# Patient Record
Sex: Male | Born: 1990 | Race: Black or African American | Hispanic: No | Marital: Single | Smoking: Never smoker
Health system: Southern US, Community
[De-identification: ages and names within clinical notes are randomized; demographics above are authoritative.]

## PROBLEM LIST (undated history)

## (undated) DIAGNOSIS — Q8501 Neurofibromatosis, type 1: Secondary | ICD-10-CM

## (undated) DIAGNOSIS — F32A Depression, unspecified: Secondary | ICD-10-CM

## (undated) DIAGNOSIS — I10 Essential (primary) hypertension: Secondary | ICD-10-CM

## (undated) DIAGNOSIS — H547 Unspecified visual loss: Secondary | ICD-10-CM

## (undated) DIAGNOSIS — F329 Major depressive disorder, single episode, unspecified: Secondary | ICD-10-CM

## (undated) HISTORY — PX: ABDOMINAL ADHESION SURGERY: SHX90

## (undated) HISTORY — PX: EYE SURGERY: SHX253

---
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
## 2010-09-07 ENCOUNTER — Inpatient Hospital Stay: Payer: Self-pay | Admitting: Unknown Physician Specialty

## 2011-01-21 ENCOUNTER — Emergency Department: Payer: Self-pay | Admitting: *Deleted

## 2011-01-24 ENCOUNTER — Inpatient Hospital Stay: Payer: Self-pay | Admitting: Internal Medicine

## 2011-01-26 ENCOUNTER — Inpatient Hospital Stay: Payer: Self-pay | Admitting: Psychiatry

## 2011-02-06 ENCOUNTER — Inpatient Hospital Stay: Payer: Self-pay | Admitting: Psychiatry

## 2011-04-21 ENCOUNTER — Inpatient Hospital Stay: Payer: Self-pay | Admitting: Psychiatry

## 2011-07-14 ENCOUNTER — Emergency Department: Payer: Self-pay | Admitting: Internal Medicine

## 2011-07-19 ENCOUNTER — Emergency Department: Payer: Self-pay | Admitting: Emergency Medicine

## 2011-08-11 ENCOUNTER — Inpatient Hospital Stay: Payer: Self-pay | Admitting: Psychiatry

## 2011-08-11 LAB — COMPREHENSIVE METABOLIC PANEL
Albumin: 4.2 g/dL (ref 3.4–5.0)
Alkaline Phosphatase: 74 U/L (ref 50–136)
BUN: 16 mg/dL (ref 7–18)
Creatinine: 1.04 mg/dL (ref 0.60–1.30)
Glucose: 90 mg/dL (ref 65–99)
SGOT(AST): 39 U/L — ABNORMAL HIGH (ref 15–37)
SGPT (ALT): 29 U/L
Total Protein: 8 g/dL (ref 6.4–8.2)

## 2011-08-11 LAB — CBC
HCT: 42.2 % (ref 40.0–52.0)
HGB: 13.2 g/dL (ref 13.0–18.0)
MCHC: 31.3 g/dL — ABNORMAL LOW (ref 32.0–36.0)
MCV: 77 fL — ABNORMAL LOW (ref 80–100)
RBC: 5.46 10*6/uL (ref 4.40–5.90)
RDW: 13.2 % (ref 11.5–14.5)
WBC: 7.1 10*3/uL (ref 3.8–10.6)

## 2011-08-11 LAB — DRUG SCREEN, URINE
Barbiturates, Ur Screen: NEGATIVE (ref ?–200)
Cannabinoid 50 Ng, Ur ~~LOC~~: NEGATIVE (ref ?–50)
Cocaine Metabolite,Ur ~~LOC~~: NEGATIVE (ref ?–300)
Methadone, Ur Screen: NEGATIVE (ref ?–300)
Opiate, Ur Screen: NEGATIVE (ref ?–300)
Phencyclidine (PCP) Ur S: NEGATIVE (ref ?–25)
Tricyclic, Ur Screen: NEGATIVE (ref ?–1000)

## 2011-08-11 LAB — TSH: Thyroid Stimulating Horm: 9.6 u[IU]/mL — ABNORMAL HIGH

## 2011-08-11 LAB — ACETAMINOPHEN LEVEL: Acetaminophen: <2 ug/mL

## 2011-09-02 ENCOUNTER — Emergency Department: Payer: Self-pay | Admitting: Emergency Medicine

## 2012-04-21 ENCOUNTER — Inpatient Hospital Stay: Payer: Self-pay | Admitting: Psychiatry

## 2012-04-21 DIAGNOSIS — Z79899 Other long term (current) drug therapy: Secondary | ICD-10-CM | POA: Diagnosis not present

## 2012-04-21 DIAGNOSIS — F329 Major depressive disorder, single episode, unspecified: Secondary | ICD-10-CM | POA: Diagnosis not present

## 2012-04-21 DIAGNOSIS — T43501A Poisoning by unspecified antipsychotics and neuroleptics, accidental (unintentional), initial encounter: Secondary | ICD-10-CM | POA: Diagnosis not present

## 2012-04-21 DIAGNOSIS — F489 Nonpsychotic mental disorder, unspecified: Secondary | ICD-10-CM | POA: Diagnosis not present

## 2012-04-21 LAB — COMPREHENSIVE METABOLIC PANEL
BUN: 8 mg/dL (ref 7–18)
Bilirubin,Total: 0.5 mg/dL (ref 0.2–1.0)
Calcium, Total: 9.1 mg/dL (ref 8.5–10.1)
Chloride: 107 mmol/L (ref 98–107)
Creatinine: 0.96 mg/dL (ref 0.60–1.30)
EGFR (African American): >60
EGFR (Non-African Amer.): >60
Glucose: 99 mg/dL (ref 65–99)
Osmolality: 280 (ref 275–301)
Potassium: 3.7 mmol/L (ref 3.5–5.1)
SGPT (ALT): 18 U/L (ref 12–78)
Sodium: 141 mmol/L (ref 136–145)
Total Protein: 7.4 g/dL (ref 6.4–8.2)

## 2012-04-21 LAB — ACETAMINOPHEN LEVEL: Acetaminophen: <2 ug/mL

## 2012-04-21 LAB — DRUG SCREEN, URINE
Barbiturates, Ur Screen: NEGATIVE (ref ?–200)
Benzodiazepine, Ur Scrn: NEGATIVE (ref ?–200)
Cocaine Metabolite,Ur ~~LOC~~: NEGATIVE (ref ?–300)
Opiate, Ur Screen: NEGATIVE (ref ?–300)
Phencyclidine (PCP) Ur S: NEGATIVE (ref ?–25)
Tricyclic, Ur Screen: POSITIVE (ref ?–1000)

## 2012-04-21 LAB — CBC
MCH: 23.9 pg — ABNORMAL LOW (ref 26.0–34.0)
MCV: 76 fL — ABNORMAL LOW (ref 80–100)
Platelet: 128 10*3/uL — ABNORMAL LOW (ref 150–440)
RDW: 14.2 % (ref 11.5–14.5)
WBC: 3.8 10*3/uL (ref 3.8–10.6)

## 2012-04-21 LAB — ETHANOL: Ethanol: <3 mg/dL

## 2012-04-23 DIAGNOSIS — F329 Major depressive disorder, single episode, unspecified: Secondary | ICD-10-CM | POA: Diagnosis not present

## 2012-04-23 LAB — LIPID PANEL
HDL Cholesterol: 62 mg/dL — ABNORMAL HIGH (ref 40–60)
Ldl Cholesterol, Calc: 65 mg/dL (ref 0–100)
Triglycerides: 73 mg/dL (ref 0–200)
VLDL Cholesterol, Calc: 15 mg/dL (ref 5–40)

## 2012-04-24 DIAGNOSIS — F329 Major depressive disorder, single episode, unspecified: Secondary | ICD-10-CM | POA: Diagnosis not present

## 2012-04-25 DIAGNOSIS — S8990XA Unspecified injury of unspecified lower leg, initial encounter: Secondary | ICD-10-CM | POA: Diagnosis not present

## 2012-04-25 DIAGNOSIS — F329 Major depressive disorder, single episode, unspecified: Secondary | ICD-10-CM | POA: Diagnosis not present

## 2012-04-26 DIAGNOSIS — F329 Major depressive disorder, single episode, unspecified: Secondary | ICD-10-CM | POA: Diagnosis not present

## 2012-04-27 DIAGNOSIS — F329 Major depressive disorder, single episode, unspecified: Secondary | ICD-10-CM | POA: Diagnosis not present

## 2012-05-02 ENCOUNTER — Emergency Department: Payer: Self-pay | Admitting: Emergency Medicine

## 2012-05-02 DIAGNOSIS — Z79899 Other long term (current) drug therapy: Secondary | ICD-10-CM | POA: Diagnosis not present

## 2012-05-02 DIAGNOSIS — S93409A Sprain of unspecified ligament of unspecified ankle, initial encounter: Secondary | ICD-10-CM | POA: Diagnosis not present

## 2012-05-02 DIAGNOSIS — M25579 Pain in unspecified ankle and joints of unspecified foot: Secondary | ICD-10-CM | POA: Diagnosis not present

## 2012-05-02 DIAGNOSIS — I10 Essential (primary) hypertension: Secondary | ICD-10-CM | POA: Diagnosis not present

## 2012-05-02 DIAGNOSIS — Q8500 Neurofibromatosis, unspecified: Secondary | ICD-10-CM | POA: Diagnosis not present

## 2013-03-17 ENCOUNTER — Emergency Department: Payer: Self-pay | Admitting: Emergency Medicine

## 2013-03-17 DIAGNOSIS — L089 Local infection of the skin and subcutaneous tissue, unspecified: Secondary | ICD-10-CM | POA: Diagnosis not present

## 2013-03-17 DIAGNOSIS — I10 Essential (primary) hypertension: Secondary | ICD-10-CM | POA: Diagnosis not present

## 2013-03-17 DIAGNOSIS — Z79899 Other long term (current) drug therapy: Secondary | ICD-10-CM | POA: Diagnosis not present

## 2013-03-17 DIAGNOSIS — H5440 Blindness, one eye, unspecified eye: Secondary | ICD-10-CM | POA: Diagnosis not present

## 2013-07-23 ENCOUNTER — Emergency Department: Payer: Self-pay | Admitting: Emergency Medicine

## 2013-07-23 DIAGNOSIS — I10 Essential (primary) hypertension: Secondary | ICD-10-CM | POA: Diagnosis not present

## 2013-07-23 DIAGNOSIS — T50901A Poisoning by unspecified drugs, medicaments and biological substances, accidental (unintentional), initial encounter: Secondary | ICD-10-CM | POA: Diagnosis not present

## 2013-07-23 DIAGNOSIS — T6591XA Toxic effect of unspecified substance, accidental (unintentional), initial encounter: Secondary | ICD-10-CM | POA: Diagnosis not present

## 2013-07-23 DIAGNOSIS — T39314A Poisoning by propionic acid derivatives, undetermined, initial encounter: Secondary | ICD-10-CM | POA: Diagnosis not present

## 2013-07-23 DIAGNOSIS — Q8500 Neurofibromatosis, unspecified: Secondary | ICD-10-CM | POA: Diagnosis not present

## 2013-07-23 DIAGNOSIS — R4182 Altered mental status, unspecified: Secondary | ICD-10-CM | POA: Diagnosis not present

## 2013-07-23 DIAGNOSIS — R918 Other nonspecific abnormal finding of lung field: Secondary | ICD-10-CM | POA: Diagnosis not present

## 2013-07-23 DIAGNOSIS — Z79899 Other long term (current) drug therapy: Secondary | ICD-10-CM | POA: Diagnosis not present

## 2013-07-23 LAB — SALICYLATE LEVEL: Salicylates, Serum: <1.7 mg/dL

## 2013-07-23 LAB — CBC
HCT: 42.8 % (ref 40.0–52.0)
HGB: 13.4 g/dL (ref 13.0–18.0)
MCH: 24 pg — AB (ref 26.0–34.0)
MCHC: 31.3 g/dL — ABNORMAL LOW (ref 32.0–36.0)
MCV: 77 fL — AB (ref 80–100)
PLATELETS: 138 10*3/uL — AB (ref 150–440)
RBC: 5.59 10*6/uL (ref 4.40–5.90)
RDW: 13.5 % (ref 11.5–14.5)
WBC: 8.2 10*3/uL (ref 3.8–10.6)

## 2013-07-23 LAB — COMPREHENSIVE METABOLIC PANEL
ALBUMIN: 3.6 g/dL (ref 3.4–5.0)
ALT: 17 U/L (ref 12–78)
AST: 28 U/L (ref 15–37)
Alkaline Phosphatase: 70 U/L
Anion Gap: 4 — ABNORMAL LOW (ref 7–16)
BUN: 13 mg/dL (ref 7–18)
Bilirubin,Total: 0.5 mg/dL (ref 0.2–1.0)
CALCIUM: 8.8 mg/dL (ref 8.5–10.1)
CO2: 26 mmol/L (ref 21–32)
Chloride: 107 mmol/L (ref 98–107)
Creatinine: 1.2 mg/dL (ref 0.60–1.30)
EGFR (Non-African Amer.): >60
GLUCOSE: 110 mg/dL — AB (ref 65–99)
Osmolality: 275 (ref 275–301)
Potassium: 3.9 mmol/L (ref 3.5–5.1)
SODIUM: 137 mmol/L (ref 136–145)
Total Protein: 7.5 g/dL (ref 6.4–8.2)

## 2013-07-23 LAB — DRUG SCREEN, URINE

## 2013-07-23 LAB — PROTIME-INR
INR: 1
Prothrombin Time: 13.5 secs (ref 11.5–14.7)

## 2013-07-23 LAB — ETHANOL

## 2013-07-23 LAB — ACETAMINOPHEN LEVEL: Acetaminophen: <2 ug/mL

## 2013-07-24 DIAGNOSIS — F332 Major depressive disorder, recurrent severe without psychotic features: Secondary | ICD-10-CM | POA: Diagnosis not present

## 2013-07-24 DIAGNOSIS — R45851 Suicidal ideations: Secondary | ICD-10-CM | POA: Diagnosis not present

## 2013-07-24 LAB — PROTIME-INR
INR: 1
Prothrombin Time: 13.2 secs (ref 11.5–14.7)

## 2013-07-24 LAB — URINALYSIS, COMPLETE
BLOOD: NEGATIVE
Bacteria: NEGATIVE
Bilirubin,UR: NEGATIVE
Glucose,UR: NEGATIVE mg/dL (ref 0–75)
KETONE: NEGATIVE
Leukocyte Esterase: NEGATIVE
NITRITE: NEGATIVE
PH: 5 (ref 4.5–8.0)
RBC, UR: NONE SEEN /HPF (ref 0–5)
SPECIFIC GRAVITY: 1.033 (ref 1.003–1.030)
Squamous Epithelial: NONE SEEN
WBC UR: NONE SEEN /HPF (ref 0–5)

## 2013-07-24 LAB — ACETAMINOPHEN LEVEL: Acetaminophen: <2 ug/mL

## 2013-07-24 LAB — SALICYLATE LEVEL

## 2013-07-25 LAB — PROTIME-INR
INR: 1
Prothrombin Time: 13.1 secs (ref 11.5–14.7)

## 2013-07-26 DIAGNOSIS — F0630 Mood disorder due to known physiological condition, unspecified: Secondary | ICD-10-CM | POA: Diagnosis not present

## 2013-09-08 ENCOUNTER — Emergency Department: Payer: Self-pay | Admitting: Emergency Medicine

## 2013-09-08 DIAGNOSIS — S139XXA Sprain of joints and ligaments of unspecified parts of neck, initial encounter: Secondary | ICD-10-CM | POA: Diagnosis not present

## 2013-12-09 ENCOUNTER — Emergency Department: Payer: Self-pay | Admitting: Emergency Medicine

## 2013-12-09 DIAGNOSIS — H1030 Unspecified acute conjunctivitis, unspecified eye: Secondary | ICD-10-CM | POA: Diagnosis not present

## 2014-10-04 NOTE — H&P (Signed)
PATIENT NAME:  Douglas Paul, Douglas Paul MR#:  [id number] DATE OF BIRTH:  1991/05/14  DATE OF ADMISSION:  04/21/2012  IDENTIFYING INFORMATION AND CHIEF COMPLAINT: This is a 24 year old man brought to the Emergency Room because of concern about an overdose of his medicine.   CHIEF COMPLAINT: "I was just trying to get some sleep."   HISTORY OF PRESENT ILLNESS: Information obtained from the patient and from the chart. The patient was brought to the Emergency Room yesterday with altered mental status, very sleepy, with a report that he had overdosed on Seroquel. He reported that he had taken 10 to 12 of the Seroquel XR 150-mg strength. He tells me today that he took them gradually over the course of a couple of hours because he wanted to get a better night's sleep and had not been sleeping well for days. He tells me that then his mother found him unconscious and knew there was something wrong with him and realized he had overdosed and that is when she called to have him brought to the hospital. The patient denies that he was trying to kill himself, although the more I ask it, the less certain he gets about it. He says that his mood mostly has been fine but then he also says he has his ups and downs that he gets frustrated frequently. He gets frustrated because he does not have a regular job and because he feels like his family is constantly criticizing him and running him down for not having more work. He is frustrated by his isolation and his living situation and his loneliness. He tends to minimize specific symptoms but does admit that he has not slept well for several days that his mood has perhaps been a little bit more frustrated. He totally denies any psychotic symptoms. He denies having any suicidal intent or homicidal ideation. He denies that he had been off of his medicines. He denies abusing alcohol or drugs. He does not present any single specific new stressor in his life.   PAST PSYCHIATRIC HISTORY: Rodel has  had multiple admissions to our hospital over the last few years since he turned 52. Most of his admissions are under similar circumstances with impulsive overdoses, often with stated suicidal ideation and depression.  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 He has responded partially to medication, but has a history of noncompliance. He has also presented with paranoia and psychotic symptoms in the past, although he does not admit it to me today. He has had multiple suicide attempts mostly by overdoses. He has been followed by Dr. Omelia Blackwater in the past but since Dr. Omelia Blackwater moved out of town the patient says that his treatment has changed. He tells me that he sees someone at "[facility]", but he cannot remember the name of them. Evidently, he is supposed to be taking Seroquel XR 150 mg at night and that is allegedly his only psychiatric medicine right now. He does not have a history of substance abuse.   SUBSTANCE ABUSE HISTORY: As noted, he does not have a history of alcohol or drug abuse.   SOCIAL HISTORY: The patient lives with his mother and his younger sister. 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 I do not see how well it has been documented, but it strikes me that he appears to probably have chronic cognitive impairment to some degree. It  is known that he only recently graduated from high school at age 21. He works in [employer], which would suggest that he is identified as a person with disabilities. The patient tells me that he has no friends and never gets out of the house or does anything except to go to his job. He fights with his mother and sister at times when he gets frustrated. He says he cries a lot because his grandmother and mother both insult him and make him feel bad about the fact that he is not working more or making more money. He seems  to have a pretty impoverished social life.   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  The patient has a history of hypertension, which has been treated with propranolol.  He also has been diagnosed with hypothyroidism in the past. Evidently does not have a seizure disorder.   FAMILY HISTORY: Positive for depression and suicide.   CURRENT MEDICATIONS: Seroquel XR 150 mg at night, propranolol 60 mg extended-release in the day. He used to take Synthroid but says that he has been off of that for a while. It is not clear if it was discontinued on purpose or not.   ALLERGIES: No known drug allergies.   REVIEW OF SYSTEMS: Currently he is minimizing symptoms. Denies being depressed. Denies suicidal ideation. Denies any psychotic symptoms. Denies panic attacks. Says that he has had trouble sleeping recently. Appetite is fine. No pain. No other new physical problems. 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   MENTAL STATUS EXAM: Casually dressed, not very well groomed young man who looks his stated age. Cooperative with the interview but fairly passive. Eye contact good. Psychomotor activity slow. Speech slow and decreased in total amount. Affect a little bit blunted, slightly confused. Mood stated as being okay. Thoughts appear simplistic, but otherwise lucid. Did not make any bizarre or delusional statements.  He currently denies auditory or visual hallucinations. Denies suicidal or homicidal ideation. Judgment and insight impaired. Baseline intelligence appears to probably be somewhat impaired. Short and longer-term memory both show some mild degree of impairment. Alert and oriented times four.   PHYSICAL EXAMINATION:  GENERAL: The patient does not appear to be in any acute physical distress. His face appears symmetric. Both of his eyes move conjugately.   HEENT: Both pupils are minimally reactive. Oral mucosa and teeth  are all intact and normal.   NECK: Supple and nontender.   BACK: Nontender.   MUSCULOSKELETAL: Full range of motion at all extremities. Normal gait.   SKIN: He has a large nodule on the left side of his neck which appears chronic. No other skin pathology identified.   NEURO: Strength and reflexes normal throughout. Cranial nerves symmetric and normal.   LUNGS: Clear with no wheezes.   HEART: Regular rate and rhythm.   ABDOMEN: Nontender, normal bowel sounds.   VITAL SIGNS: Temperature 98.8, pulse 76, respirations 20, blood pressure 138/83.   ASSESSMENT:  24 year old man with a history of recurrent depression and suicide attempts comes in after taking an excessive amount of Seroquel. He is denying that there was anything suicidal about it, but it sounds like with his history it is most likely that there was some suicidal intent. Needs hospitalization because of significant risk of suicidal behavior, recurrent depression, and disability.   TREATMENT PLAN: Admit to psychiatry. I am going to double the Seroquel to 300 mg at night as  a more effective dose for depression. The patient agrees to the plan. Engage him in daily group and individual therapy, try to get collateral information if possible, try and get information about his outpatient followup.  I am going to recheck his thyroid tests and get a full thyroid panel. Continue current propranolol. Hold off on Synthroid until we get more lab results. Also we will check lipid panel.   DIAGNOSIS PRINCIPLE AND PRIMARY:  AXIS I: Depression, not otherwise specified.   SECONDARY DIAGNOSES:  AXIS I: Rule out chronic intellectual impairment secondary to generalized medical condition.   AXIS II: Deferred.   AXIS III: Neurofibromatosis, high blood pressure, rule out hypothyroidism.   AXIS IV: Severe from chronic loneliness.   AXIS V: Functioning at time of evaluation: 30.   ____________________________ Audery AmelJohn T. Clapacs,  MD jtc:bjt D: 04/22/2012 13:43:14 ET T: 04/22/2012 14:12:31 ET JOB#: [id number]  cc: Audery AmelJohn T. Clapacs, MD, <Dictator> Audery AmelJOHN T CLAPACS MD ELECTRONICALLY SIGNED 04/22/2012 14:56

## 2014-10-04 NOTE — Discharge Summary (Signed)
PATIENT NAME:  Douglas Paul, Douglas Paul MR#:  [id number] DATE OF BIRTH:  1990/11/21  DATE OF ADMISSION:  04/21/2012 DATE OF DISCHARGE:  04/27/2012  HOSPITAL COURSE: See dictated History and Physical for details of admission. This 24 year old man with a history of recurrent suicide attempts and depressive symptoms came into the hospital after overdosing on his Seroquel. In the hospital, he insisted that he had no suicidal ideation and minimized or denied any of his mood symptoms. He did at least open up to the point of discussing his frustration with his living situation. He frequently feels that his mother and grandmother are putting him down or criticizing him and at the same time feels angry that he is isolated at home with little to do and no transportation. He admits that he had been frustrated and could not really explain his thinking process when he took the overdose. In the hospital, he did not display any dangerous or suicidal behavior. Initially he was fairly withdrawn, but by the time of discharge he had become more interactive, attended some groups, and was more socially appropriate. The patient was counseled extensively about the importance of being more assertive with his family and being more involved in outpatient treatment and social activities. In addition, he was counseled about the importance of staying on his medication and not misusing it. The patient showed improved insight during his hospital stay. Medically he was treated with Seroquel, and the dose was increased to 300 mg at bedtime for what should be a better antidepressant response. He tolerated this well without any difficulty. He continued on propranolol, his usual treatment for his high blood pressure. He was treated with 1% hydrocortisone cream for a rash on his right elbow area.   DISCHARGE MEDICATIONS:  1. Quetiapine extended-release 300 mg p.o. at bedtime.  2. Hydrocortisone 1% cream applied to affected area twice a day as needed for  itching. 3. Propranolol LA 60 mg p.o. in a.m.   LABORATORY, DIAGNOSTIC AND RADIOLOGICAL DATA: Admission labs showed acetaminophen nondetectable. Chemistry panel was entirely normal. Alcohol was nondetectable. CBC showed a platelet count low at 128, MCV low at 76, otherwise normal. Salicylates undetectable. TSH elevated at 6.39. Tricyclic antidepressants positive in the urinalysis drug screen. EKG normal. Total cholesterol 142, triglycerides 73, HDL elevated at 62. Because of his abnormal thyroid tests, follow-up was done and showed a free T4 slightly low at 3.4, T3 uptake high at 44. The patient was not treated for this but was referred for follow-up with his primary care doctor for further evaluation of thyroid issues.   DISCHARGE MENTAL STATUS EXAMINATION: Neatly dressed and groomed young man, looks his stated age. Eye contact adequate. Psychomotor activity normal. Speech quiet, decreased in total amount. Affect smiling but still somewhat constricted. Mood stated as fine. Thoughts appear lucid with no loosening of associations or delusional content, simple and slow, however. Denied auditory or visual hallucinations. Denied suicidal or homicidal ideation. Showed slightly improved insight and judgment. Baseline intelligence appears to probably be somewhat impaired. Short and long-term memory generally intact.   DISPOSITION: The patient is discharged home with his family with follow-up treatment arranged in the community with [facility], Dr. Ellsworth Lennoxejan-Sie, and [facility].   DISCHARGE DIAGNOSIS, PRINCIPAL AND PRIMARY:  AXIS I: Depression, not otherwise specified.   SECONDARY DIAGNOSES:  AXIS I: No further.   AXIS II: Rule out mild mental retardation versus possible autistic disorder.   AXIS III:  1. Chronic blindness. 2. Rule out hypothyroid.  3.  Hypertension.   AXIS IV: Moderate. Chronic stress from burden of illness, limited resources.   AXIS V: Functioning at time of  discharge 55.   ____________________________ Audery Amel, MD jtc:cbb D: 04/28/2012 22:13:53 ET T: 04/29/2012 07:20:56 ET JOB#: [id number]  cc: Audery Amel, MD, <Dictator> Audery Amel MD ELECTRONICALLY SIGNED 04/29/2012 10:04

## 2014-10-04 NOTE — H&P (Signed)
PATIENT NAME:  Douglas Paul, Douglas Paul MR#:  [id number] DATE OF BIRTH:  [birth year]  DATE OF ADMISSION:  04/21/2012  HOSPITAL COURSE: See dictated History and Physical for details of admission. This 24 year old man with a history of recurrent suicide attempts and depressive symptoms came into the hospital after overdosing on his Seroquel. In the hospital, he insisted that he had no suicidal ideation and minimized or denied any of his mood symptoms. He did at least open up to the point of discussing his frustration with his living situation. He frequently feels that his mother and grandmother are putting him down or criticizing him and at the same time feels angry that he is isolated at home with little to do and no transportation. He admits that he had been frustrated and could not really explain his thinking process when he took the overdose. In the hospital, he did not display any dangerous or suicidal behavior. Initially he was fairly withdrawn, but by the time of discharge he had become more interactive, attended some groups, and was more socially appropriate. The patient was counseled extensively about the importance of being more assertive with his family and being more involved in outpatient treatment and social activities. In addition, he was counseled about the importance of staying on his medication and not misusing it. The patient showed improved insight during his hospital stay. Medically he was treated with Seroquel, and the dose was increased to 300 mg at bedtime for what should be a better antidepressant response. He tolerated this well without any difficulty. He continued on propranolol, his usual treatment for his high blood pressure. He was treated with 1% hydrocortisone cream for a rash on his right elbow area.   DISCHARGE MEDICATIONS:  1. Quetiapine extended-release 300 mg p.o. at bedtime.  2. Hydrocortisone 1% cream applied to affected area twice a day as needed for itching. 3. Propranolol LA 60 mg  p.o. in a.m.   LABORATORY, DIAGNOSTIC AND RADIOLOGICAL DATA: Admission labs showed acetaminophen nondetectable. Chemistry panel was entirely normal. Alcohol was nondetectable. CBC showed a platelet count low at 128, MCV low at 76, otherwise normal. Salicylates undetectable. TSH elevated at 6.39. Tricyclic antidepressants positive in the urinalysis drug screen. EKG normal. Total cholesterol 142, triglycerides 73, HDL elevated at 62. Because of his abnormal thyroid tests, follow-up was done and showed a free T4 slightly low at 3.4, T3 uptake high at 44. The patient was not treated for this but was referred for follow-up with his primary care doctor for further evaluation of thyroid issues.   DISCHARGE MENTAL STATUS EXAMINATION: Neatly dressed and groomed young man, looks his stated age. Eye contact adequate. Psychomotor activity normal. Speech quiet, decreased in total amount. Affect smiling but still somewhat constricted. Mood stated as fine. Thoughts appear lucid with no loosening of associations or delusional content, simple and slow, however. Denied auditory or visual hallucinations. Denied suicidal or homicidal ideation. Showed slightly improved insight and judgment. Baseline intelligence appears to probably be somewhat impaired. Short and long-term memory generally intact.   DISPOSITION: The patient is discharged home with his family with follow-up treatment arranged in the community with [facility], Dr. Ellsworth Lennoxejan-Sie, and [facility]   DISCHARGE DIAGNOSIS, PRINCIPAL AND PRIMARY:  AXIS I: Depression, not otherwise specified.   SECONDARY DIAGNOSES:  AXIS I: No further.   AXIS II: Rule out mild mental retardation versus possible autistic disorder.   AXIS III:  1. Chronic blindness. 2. Rule out hypothyroid.  3. Hypertension.   AXIS IV:  Moderate. Chronic stress from burden of illness, limited resources.   AXIS V: Functioning at time of discharge 55.    ____________________________ Audery Amel, MD jtc:cbb D: 04/28/2012 22:13:53 ET T: 04/29/2012 07:20:56 ET JOB#: [id number]  cc: Audery Amel, MD, <Dictator> Audery Amel MD ELECTRONICALLY SIGNED 04/29/2012 10:04

## 2014-10-08 NOTE — Consult Note (Signed)
PATIENT NAME:  Douglas Paul, Douglas Paul MR#:  [id number] DATE OF BIRTH:  October 03, 1990  DATE OF CONSULTATION:  07/24/2013  CONSULTING PHYSICIAN:  Trejuan Matherne K. Franchot Mimes, MD  SEX: Male. RACE: African American. AGE: 24 years.  The patient was seen in consultation in ED-BHU room.  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 The patient is single, never married, and lives with his mother who is in her 11s. The patient was brought to Emergency Room at [facility] after he had taken an overdose of "possibly rat poison or pain medication" that belonged to the mother. The patient denies this, and he stated that he never took rat poison but he took some allergy pills that belonged to his sister. The patient reports that he has been dating this girl for the past 2 months and found out that she "cheated on him on the Facebook and met her on the internet." The patient denies this, and he says his mother does not like this girl because she is bisexual and patient had arguments about the same.   PAST PSYCHIATRIC HISTORY: History of inpatient hold psychiatry once before for episode of depression and taking overdose of pills at [facility] about a year ago. History of suicide attempt once before when he tried to overdose on pills.   ALCOHOL AND DRUGS: Denies drinking alcohol. Denies street or prescription drug abuse. Denies smoking nicotine cigarettes.  OBJECTIVE: The patient is dressed in hospital clothes. Alert and oriented, calm, pleasant and cooperative. Fully aware of situation that brought him to [facility], and he reports that the ambulance was called by his mother because she was concerned about him taking his sister's pills which are allergy pills. Denies feeling depressed and said not now. Denies feeling hopeless or helpless. Denies feeling worthless or useless. Did have suicidal wishes but no suicide thought and no suicide attempt, and he did not want to hurt himself. He was just upset  about the situation. No psychosis. Denies auditory or visual hallucinations. Denies hearing voices. Denies paranoia or suspicious ideas. Denies loss in thought control. Insight and judgment fair.  IMPRESSION: Major depression, recurrent, with suicidal ideas but currently contracts for safety.  PLAN: Continue current observation. Consider discharge on Monday, that is, 07/26/2013, when he has to go back to work. Mr. Sharen Hones is to evaluate the patient on Monday, 07/26/2013, and consider discharge with followup appointment at a local mental health clinic.   ____________________________ Wallace Cullens. Franchot Mimes, MD skc:jcm D: 07/24/2013 19:46:16 ET T: 07/24/2013 20:39:40 ET JOB#: [id number]  cc: Arlyn Leak K. Franchot Mimes, MD, <Dictator> Dewain Penning MD ELECTRONICALLY SIGNED 07/25/2013 12:23

## 2014-10-08 NOTE — Consult Note (Signed)
PATIENT NAME:  Douglas Paul, Douglas Paul MR#:  [id number] DATE OF BIRTH:  08-Jan-1991  DATE OF CONSULTATION:  07/26/2013  REFERRING PHYSICIAN:  Algis Liming. Jimmye Norman, MD CONSULTING PHYSICIAN:  Evaleen Sant B. Kielyn Kardell, MD  REASON FOR CONSULTATION: To evaluate a suicidal patient.   IDENTIFYING DATA: Douglas Paul is a 24 year old male with history of depression and neurofibromatosis.   CHIEF COMPLAINT: "I was arguing with my mother."   HISTORY OF PRESENT ILLNESS: Douglas Paul has been hospitalized at [facility] for depression several times already. In addition to mental illness, he has neurofibromatosis. He has been a patient at [facility] neurology for many years now. His younger sister also suffers the same illness. The patient has a history of poor compliance with psychiatric  medications. He was discharged from [facility] at the end of 2013, but has not seen a psychiatrist or taken any medications ever since. The mother reports that the patient is a difficult one and in the past, he would see a psychiatrist once or twice and then stop taking any medications or seeing a doctor. He did like Dr. Rosine Door. Unfortunately, after Dr. Rosine Door relocated to [facility], this relationship was broken. On the day of admission, the patient was arguing with his mother. His mother does not like his girlfriend who is bisexual, and the patient threatened to eat rat poison. There is no rat poison in the house but instead, he took some allergy medication belonging to his sister. He has been in the Emergency Room for 4 days and has no symptoms of poisoning. He wants to be discharged. He denies that it was a suicide attempt. His mother agrees this is his pattern of behavior when he does not "get his way." He threatens to take pills and oftentimes takes 1 or 2 pills that he can find around the house. The mother did not feel that hospitalization was necessary and felt that the patient needed to return home and to  work. The patient is enormously proud of his ability to awake and support the family. His father left with the family and for the longest time, the mother was the sole provider. When Douglas Paul finished high school, he was thrilled to be able to help.   PAST PSYCHIATRIC HISTORY: He has been hospitalized several times here. There is a history of overdose and a history of drinking antifreeze. He used to see Dr. Rosine Door. He goes to [facility].   FAMILY PSYCHIATRIC HISTORY: Mother and grandmother with depression.   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   MEDICATIONS AT THE TIME OF MOST RECENT DISCHARGE: Inderal 60 mg daily, Seroquel XR 300 mg at night.   ALLERGIES: No known drug allergies.   SOCIAL HISTORY: He has a 28 year old sister. Lives with his mother, who is disabled from [place]. He graduated from high school and now is employed by a [employer], [employer].   REVIEW OF SYSTEMS: CONSTITUTIONAL: No fevers or chills. No weight changes.  EYES: Blindness in the right eye.  ENT: No hearing loss.  RESPIRATORY: No shortness of breath or cough.  CARDIOVASCULAR: No chest pain or orthopnea.  GASTROINTESTINAL: No abdominal pain, nausea, vomiting, or diarrhea.  GENITOURINARY: No incontinence or frequency.  ENDOCRINE: No heat or cold intolerance.  LYMPHATIC: No anemia or easy bruising.  INTEGUMENTARY: No acne or rash.  MUSCULOSKELETAL: No muscle or joint pain.  NEUROLOGIC: Positive for neurofibromatosis  with central nervous system lesions.  PSYCHIATRIC: See history of present illness for details.   PHYSICAL EXAMINATION: VITAL SIGNS: Blood pressure 134/93, pulse 59, respirations 18, temperature 98.  GENERAL: This is a slender young male in no acute distress.   The rest of the physical examination is deferred to his primary attending.   LABORATORY DATA:  Chemistries are within normal limits. Blood alcohol level is zero. LFTs within normal limits. Urine tox screen is positive for tricyclic antidepressants, as if he were taking Seroquel. CBC within normal limits with low platelets of 138. PT and INR within normal limits x 3. Urinalysis is not suggestive of urinary tract infection. Serum acetaminophen and salicylates are low.   EKG: Normal sinus rhythm, normal EKG.   MENTAL STATUS EXAMINATION: The patient is alert and oriented to person, place, time, and situation. He is pleasant, polite, and cooperative. He is very well groomed. He maintains good eye contact. His speech is of normal rhythm, rate, and volume. Mood is fine with full affect. Thought process is logical and goal oriented. Thought content: He denies suicidal or homicidal ideation, but was brought to the Emergency Room after presumed overdose on medication or rat poison. I think that rat poison ingestion was ruled out. There are no delusions or paranoia. There are no auditory or visual hallucinations. His cognition is grossly intact. He registers 3 out of 3 and recalls 3 out of 3 objects after 5 minutes. He can spell "world" forwards and backwards. He knows the current president. His intelligence and fund of knowledge are average. His insight and judgment are limited.   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  AXIS II: None.  AXIS III: Neurofibromatosis, hypertension, blindness in the right eye.  AXIS IV: Mental and physical illness, treatment compliance, family conflict.  AXIS V: Global Assessment of Functioning 55.   PLAN:  1.  The patient denies thoughts of hurting himself or other people. He no longer meets criteria for involuntary inpatient psychiatric commitment. I will terminate proceedings. Please discharge as appropriate.  2.  He is interested in restarting Inderal and Seroquel. I provided prescriptions.  3.  He met  with Lanae Boast from [facility] and agreed to follow up with them. Lanae Boast will provide support and transportation.  4.  His mother will pick him up. She is in court today and was counting on her son helping out.  ____________________________ Wardell Honour. Bary Leriche, MD jbp:jcm D: 07/26/2013 16:20:25 ET T: 07/26/2013 19:23:45 ET JOB#: [id number]  cc: Laydon Martis B. Bary Leriche, MD, <Dictator> Clovis Fredrickson MD ELECTRONICALLY SIGNED 08/15/2013 23:09

## 2014-10-08 NOTE — Consult Note (Signed)
Brief Consult Note: Diagnosis: Major depressive disorder recurrent moderate.   Patient was seen by consultant.   Consult note dictated.   Recommend further assessment or treatment.   Orders entered.   Comments: Mr. Bacigalupi has a h/o depression. He has been off medication since his last discharge in 2013. After an argument with his mother he threatened to overdose on rat poison (none in the house). He ended up taking allergy medication prescribed for his siter.  He is cool and collected. No longer suicidal or homicidal. Agrees to restrt medications.   PLAN: 1. The patient no longer meets criteria for IVC. I will terminate proceedings. Please discharge as appropriate.   2. We restarted Seroquel XR 300 mg at night for depression and Propranolol for HTN. Rx given.  3. He will follow up with [facility]. He met with Lanae Boast from [facility] already here who would assist.  4. Needs a not for work. May return with no restrictions tomorrow.  Electronic Signatures: Orson Slick (MD)  (Signed 09-Feb-15 13:25)  Authored: Brief Consult Note   Last Updated: 09-Feb-15 13:25 by Orson Slick (MD)

## 2014-10-09 NOTE — H&P (Signed)
PATIENT NAME:  Douglas Paul, Douglas Paul MR#:  [id number] DATE OF BIRTH:  07/20/1990  DATE OF ADMISSION:  08/11/2011  INITIAL ASSESSMENT AND PSYCHIATRIC EVALUATION  IDENTIFYING INFORMATION:  The  is a 24 year old African American male who is not married, employed through a temporary agency, and lives with his mother who is 74 years old.  The patient comes for readmission to [facility] after his last discharge from this facility on 04/23/2011 with a chief complaint, "I got into it with my mother and all I did was go outside to pick up soda and she locked me out and I was going to do something stupid to somebody and I could not contract for safety and they brought me here."   HISTORY OF PRESENT ILLNESS:  The patient lives with his mother and has been living with her for many years.  They get into conflicts sometimes and arguments.  According to information obtained from the charts, mother locked the patient out and he was going to do something stupid like hurting his mother by doing something harmful to her and safety was a concern and so he was recommended inpatient hospitalization to psychiatry. The patient has a past history of decompensating under minimal stress and becoming dangerous to himself and behavior becoming unpredictable.  PAST PSYCHIATRIC HISTORY:  The patient has had several inpatient hospitalizations on psychiatry and several hospitalizations to [facility] for depression and problems related to the same.  On one occasion he overdosed himself by drinking antifreeze.  He reports that he is a patient of Dr. Omelia Blackwater and last appointment was in December 2013 and next appointment is to be made.  The patient was last discharged on 04/23/2011 from [facility] after being stabilized for mood disorder secondary to medical condition- neurofibromatosis on the following medications: Zyprexa 10 mg p.o. at bedtime, Prozac  40 mg daily, and Zestril 5 mg daily.  The patient reports that he last took his medications in December 2013 and could not take them anymore because Medicaid would not pay for them.  He has tried to kill himself about four times so far and on one occasion he drank antifreeze.  On three other occasions he tried to overdose on prescription medications.  FAMILY HISTORY OF MENTAL ILLNESS:  Mother and grandmother have depression.  No known history of suicides in the family.  FAMILY HISTORY:  Raised by parents.  His father lives in [place] and is not in touch with him.  The patient lives with his mother.  Mother stays home and does not work.  He has a sister who is 2 years old who lives with the mother and the patient.  The patient gets along "sometimes, not all the time".  Mother is disabled from AIDS and is not able to work.  PERSONAL HISTORY:  Born in [place], [place].  Currently he is in high school and is in special classes and special education and he needs attention on a one-on-one basis.  School is frequently interrupted because of hospitalizations. Currently he is making good grades at school and hopes to graduate this year.  WORK HISTORY:  Worked through temporary agencies and never had a regular job.  MILITARY HISTORY:  Not applicable.  MARRIAGES:  Never married.  Never dated.  Did not go to the high school prom.  No children.  ALCOHOL AND DRUGS:  First drink of alcohol- never had alcohol.  Denies street or prescription drug abuse except  when he overdoses on prescription medications.  He used to smoke nicotine cigarettes, a few per day, and currently quit smoking them.  MEDICAL HISTORY:   No known diabetes mellitus.  No major surgeries.  No major injuries.  Has neurofibromatosis with history of treatment with carboplatin and status post tumor debulking.  Blindness in the right eye from neurofibromatosis.  Does have hypertension, which is mild.  Being followed by Dr. Reece Agar. at [facility] for his neurofibromatosis.  Last appointment was in 2012.  Next appointment is coming up in three months.  PHYSICAL EXAMINATION: VITAL SIGNS:  Blood pressure 120/70 mmHg.  Pulse 80 per minute and regular.  Respirations 20 per minute, temperature 97.6.  HEENT:  Head is normocephalic, atraumatic.  Eyes- pupils equal, round, reactive to light and accommodation.  Fundi bilaterally benign.  NECK:  Supple without any thyromegaly or lymphadenopathy.  LUNGS:  Clear to auscultation.  HEART:   Normal S1, S2 without any murmurs, rubs, or gallops.    ABDOMEN:  Soft, nontender.  Bowel sounds heard.  RECTAL:  Examination deferred.  MUSCULOSKELETAL:  Normal muscle strength in all extremities.  SKIN:  No rashes or bruises.  LYMPH:  No cervical lymphadenopathy.  NEUROLOGICAL:  Gait is normal.  Romberg is negative.  Cranial nerves II-XII grossly intact and normal.  Blindness in the right eye.  DTRs 2+ and normal.  Plantars are normal response.   MENTAL STATUS EXAMINATION:  The patient is dressed in hospital clothes.  Alert and oriented to place, person, and time.  Fully aware of the situation that brought him for admission to inpatient psychiatry.  He is pleasant and cooperative.  Adequately groomed and casually dressed, wearing prescription glasses.  Maintains good eye contact.  Speech is normal in rate and coherent in content.  Admits that he was depressed when he came in but not anymore and he feels hopeful about himself.  Thought processes are logical and goal oriented.  Denies auditory or visual hallucinations.  Denies hearing voices or seeing things.  Denies paranoid or suspicious ideas.  Denies any thought insertion.  He could spell the word "world" forward and backward without any problem.  He could count money.  General knowledge information is fair for level of education.  Cognition grossly intact.  Denies any appetite or sleep disturbance.  Insight and judgment are  fair.  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  AXIS II:  None.  AXIS III:  Neurofibromatosis, mild hypertension, and blindness in the right eye.  AXIS IV:  Chronic mental and physical illness.  Treatment compliance because Medicaid will not fill his medications.  Constant conflicts with his mother that he lives with.  AXIS V:  GAF 25.    PLAN:  The patient is admitted to [facility] for close observation and management and help.  He will be started back on all of his medications that he was recommended at the time of discharge in November 2012.  These medications have really helped him.  During the stay in the hospital he will be given milieu therapy and supportive counseling.  He will take part in individual and group therapy when he is ready to do so where conflicts with family members and dealing with adequate coping skills will be discussed.  Social services will contact the family and find out how best they can help so that the patient and mother can relate with each other.  At the  time of discharge the patient will be stable and appropriate follow-up appointments will be given and community and social services will find out about medication compliance and a way to deal with it and find a program that will help him.   ____________________________ Jannet MantisSurya K. Guss Bundehalla, MD skc:bjt D: 08/11/2011 19:22:33 ET T: 08/12/2011 06:23:40 ET JOB#: [id number]  cc: Monika SalkSurya K. Guss Bundehalla, MD, <Dictator> Beau FannySURYA K Haylynn Pha MD ELECTRONICALLY SIGNED 08/18/2011 20:20

## 2015-09-12 DIAGNOSIS — I10 Essential (primary) hypertension: Secondary | ICD-10-CM | POA: Diagnosis not present

## 2015-09-12 DIAGNOSIS — Q8501 Neurofibromatosis, type 1: Secondary | ICD-10-CM | POA: Diagnosis not present

## 2015-09-12 DIAGNOSIS — E039 Hypothyroidism, unspecified: Secondary | ICD-10-CM | POA: Diagnosis not present

## 2016-01-26 ENCOUNTER — Encounter: Payer: Self-pay | Admitting: Emergency Medicine

## 2016-01-26 ENCOUNTER — Emergency Department: Payer: Medicare Other

## 2016-01-26 ENCOUNTER — Emergency Department
Admission: EM | Admit: 2016-01-26 | Discharge: 2016-01-26 | Disposition: A | Discharge status: Home / Self Care | Payer: Medicare Other | Attending: Student | Admitting: Student

## 2016-01-26 DIAGNOSIS — K859 Acute pancreatitis without necrosis or infection, unspecified: Secondary | ICD-10-CM | POA: Insufficient documentation

## 2016-01-26 DIAGNOSIS — R52 Pain, unspecified: Secondary | ICD-10-CM

## 2016-01-26 DIAGNOSIS — G8929 Other chronic pain: Secondary | ICD-10-CM | POA: Diagnosis not present

## 2016-01-26 DIAGNOSIS — I10 Essential (primary) hypertension: Secondary | ICD-10-CM | POA: Insufficient documentation

## 2016-01-26 DIAGNOSIS — K5900 Constipation, unspecified: Secondary | ICD-10-CM | POA: Insufficient documentation

## 2016-01-26 DIAGNOSIS — R1013 Epigastric pain: Secondary | ICD-10-CM | POA: Diagnosis not present

## 2016-01-26 DIAGNOSIS — M25512 Pain in left shoulder: Secondary | ICD-10-CM | POA: Diagnosis not present

## 2016-01-26 DIAGNOSIS — R748 Abnormal levels of other serum enzymes: Secondary | ICD-10-CM | POA: Diagnosis not present

## 2016-01-26 HISTORY — DX: Essential (primary) hypertension: I10

## 2016-01-26 HISTORY — DX: Neurofibromatosis, type 1: Q85.01

## 2016-01-26 HISTORY — DX: Depression, unspecified: F32.A

## 2016-01-26 HISTORY — DX: Unspecified visual loss: H54.7

## 2016-01-26 HISTORY — DX: Major depressive disorder, single episode, unspecified: F32.9

## 2016-01-26 LAB — URINALYSIS COMPLETE WITH MICROSCOPIC (ARMC ONLY)
BACTERIA UA: NONE SEEN
Bilirubin Urine: NEGATIVE
GLUCOSE, UA: NEGATIVE mg/dL
Hgb urine dipstick: NEGATIVE
Leukocytes, UA: NEGATIVE
Nitrite: NEGATIVE
Protein, ur: NEGATIVE mg/dL
Specific Gravity, Urine: 1.024 (ref 1.005–1.030)
Squamous Epithelial / LPF: NONE SEEN
WBC UA: NONE SEEN WBC/hpf (ref 0–5)
pH: 5 (ref 5.0–8.0)

## 2016-01-26 LAB — CBC WITH DIFFERENTIAL/PLATELET
Basophils Absolute: 0 10*3/uL (ref 0–0.1)
Basophils Relative: 1 %
Eosinophils Absolute: 0 10*3/uL (ref 0–0.7)
Eosinophils Relative: 1 %
HEMATOCRIT: 45.3 % (ref 40.0–52.0)
HEMOGLOBIN: 14.2 g/dL (ref 13.0–18.0)
LYMPHS ABS: 1.5 10*3/uL (ref 1.0–3.6)
Lymphocytes Relative: 22 %
MCH: 23.8 pg — AB (ref 26.0–34.0)
MCHC: 31.4 g/dL — AB (ref 32.0–36.0)
MCV: 76 fL — ABNORMAL LOW (ref 80.0–100.0)
MONO ABS: 0.5 10*3/uL (ref 0.2–1.0)
MONOS PCT: 8 %
NEUTROS ABS: 4.5 10*3/uL (ref 1.4–6.5)
NEUTROS PCT: 68 %
Platelets: 128 10*3/uL — ABNORMAL LOW (ref 150–440)
RBC: 5.96 MIL/uL — ABNORMAL HIGH (ref 4.40–5.90)
RDW: 13.2 % (ref 11.5–14.5)
WBC: 6.5 10*3/uL (ref 3.8–10.6)

## 2016-01-26 LAB — COMPREHENSIVE METABOLIC PANEL
ALK PHOS: 60 U/L (ref 38–126)
ALT: 19 U/L (ref 17–63)
ANION GAP: 6 (ref 5–15)
AST: 26 U/L (ref 15–41)
Albumin: 4.3 g/dL (ref 3.5–5.0)
BILIRUBIN TOTAL: 0.8 mg/dL (ref 0.3–1.2)
BUN: 11 mg/dL (ref 6–20)
CALCIUM: 9.3 mg/dL (ref 8.9–10.3)
CO2: 27 mmol/L (ref 22–32)
CREATININE: 0.91 mg/dL (ref 0.61–1.24)
Chloride: 104 mmol/L (ref 101–111)
GLUCOSE: 76 mg/dL (ref 65–99)
Potassium: 3.8 mmol/L (ref 3.5–5.1)
Sodium: 137 mmol/L (ref 135–145)
Total Protein: 7.5 g/dL (ref 6.5–8.1)

## 2016-01-26 LAB — LIPASE, BLOOD: Lipase: 240 U/L — ABNORMAL HIGH (ref 11–51)

## 2016-01-26 MED ORDER — ONDANSETRON 4 MG PO TBDP: 4.0000 mg | ORAL_TABLET | Freq: Three times a day (TID) | ORAL | 0 refills | Status: DC | PRN

## 2016-01-26 MED ORDER — ONDANSETRON HCL 4 MG/2ML IJ SOLN
4.0000 mg | Freq: Once | INTRAMUSCULAR | Status: AC
  Administered 2016-01-26: 4 mg via INTRAVENOUS
  Filled 2016-01-26: qty 2

## 2016-01-26 MED ORDER — OXYCODONE HCL 5 MG PO TABS: 5.0000 mg | ORAL_TABLET | Freq: Four times a day (QID) | ORAL | 0 refills | Status: DC | PRN

## 2016-01-26 MED ORDER — SODIUM CHLORIDE 0.9 % IV BOLUS (SEPSIS)
1000.0000 mL | Freq: Once | INTRAVENOUS | Status: AC
  Administered 2016-01-26: 1000 mL via INTRAVENOUS

## 2016-01-26 MED ORDER — MORPHINE SULFATE (PF) 4 MG/ML IV SOLN
4.0000 mg | Freq: Once | INTRAVENOUS | Status: AC
  Administered 2016-01-26: 4 mg via INTRAVENOUS
  Filled 2016-01-26: qty 1

## 2016-01-26 NOTE — ED Provider Notes (Signed)
[facility] Emergency Department Provider Note   ____________________________________________   First MD Initiated Contact with Patient 01/26/16 1518     (approximate)  I have reviewed the triage vital signs and the nursing notes.   HISTORY  Chief Complaint Abdominal Pain    HPI Douglas Paul is a 25 y.o. 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 He has had some nausea but no vomiting, no diarrhea, no fevers or chills. He has chronic constipation. No pain or burning with urination. He has never had this before. Seen at urgent care and sent to the ER for mild amylase and lipase elevations. He drinks alcohol infrequently "once or twice a year" and has not had any alcohol this week. He denies any chest pain or difficulty breathing.   Past Medical History:  Diagnosis Date  . Depression   . Hypertension   . Neurofibromatosis, type 1 (HCC)   . Partial blindness    right eye    There are no active problems to display for this patient.   Past Surgical History:  Procedure Laterality Date  . ABDOMINAL ADHESION SURGERY     tumors removed  . EYE SURGERY      Prior to Admission medications   Not on File    Allergies Review of patient's allergies indicates no known allergies.  No family history on file.  Social History Social History  Substance Use Topics  . Smoking status: Never Smoker  . Smokeless tobacco: Never Used  . Alcohol use No    Review of Systems Constitutional: No fever/chills Eyes: No visual changes. ENT: No sore throat. Cardiovascular: Denies chest pain. Respiratory: Denies shortness of breath. Gastrointestinal: + abdominal pain.  + nausea, no vomiting.  No diarrhea.  + constipation. Genitourinary: Negative for  dysuria. Musculoskeletal: Negative for back pain. Skin: Negative for rash. Neurological: Negative for headaches, focal weakness or numbness.  10-point ROS otherwise negative.  ____________________________________________   PHYSICAL EXAM:  Vitals:   01/26/16 1452 01/26/16 1601  BP: 134/90 (!) 148/98  Pulse: (!) 53 64  Resp: 17 16  Temp: 97.9 F (36.6 C)   TempSrc: Oral Oral  SpO2: 100% 100%  Weight: 150 lb (68 kg)   Height:  (1.753 m)       Constitutional: Alert and oriented. Well appearing and in no acute distress. Eyes: Conjunctivae are normal. PERRL. EOMI. Head: Atraumatic. Nose: No congestion/rhinnorhea. Mouth/Throat: Mucous membranes are moist.  Oropharynx non-erythematous. Neck: No stridor.   Cardiovascular: Normal rate, regular rhythm. Grossly normal heart sounds.  Good peripheral circulation. Respiratory: Normal respiratory effort.  No retractions. Lungs CTAB. Gastrointestinal: Soft With mild epigastric tenderness. Normal bowel sounds, no rebound or guarding. No rigidity. No CVA tenderness. Genitourinary: Deferred Musculoskeletal: No lower extremity tenderness nor edema.  No joint effusions. Neurologic:  Normal speech and language. No gross focal neurologic deficits are appreciated. No gait instability. Skin:  Skin is warm, dry and intact. No rash noted. Psychiatric: Mood and affect are normal. Speech and behavior are normal.  ____________________________________________   LABS (all labs ordered are listed, but only abnormal results are displayed)  Labs Reviewed  CBC WITH DIFFERENTIAL/PLATELET - Abnormal; Notable for the following:       Result Value   RBC 5.96 (*)    MCV 76.0 (*)    MCH 23.8 (*)  MCHC 31.4 (*)    Platelets 128 (*)    All other components within normal limits  LIPASE, BLOOD - Abnormal; Notable for the following:    Lipase 240 (*)    All other components within normal limits  URINALYSIS COMPLETEWITH MICROSCOPIC ([facility] ONLY) -  Abnormal; Notable for the following:    Color, Urine YELLOW (*)    APPearance CLEAR (*)    Ketones, ur TRACE (*)    All other components within normal limits  COMPREHENSIVE METABOLIC PANEL   ____________________________________________  EKG  none ____________________________________________  RADIOLOGY  RUQ ultrasound  IMPRESSION: Normal right upper quadrant ultrasound. ____________________________________________   PROCEDURES  Procedure(s) performed: None  Procedures  Critical Care performed: No  ____________________________________________   INITIAL IMPRESSION / ASSESSMENT AND PLAN / ED COURSE  Pertinent labs & imaging results that were available during my care of the patient were reviewed by me and considered in my medical decision making (see chart for details).  Douglas Paul is a 25 y.o. 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 On exam, he is very well-appearing and in no acute distress. Vital signs stable, he is afebrile. Normal bowel sounds with mild tenderness in the epigastrium, no rebound or guarding. We will obtain abdominal pain labs, urinalysis, likely right upper quadrant ultrasound to rule out obstructing stone at the cause of pancreatitis. We'll treat his pain and reassess for disposition.  ----------------------------------------- 5:02 PM on 01/26/2016 ----------------------------------------- Patient with significant improvement of his pain at this time. I reviewed his labs. CBC shows mild chronic thrombocytopenia. Unremarkable CMP, lipase is mildly elevated 240, unremarkable ultrasound of the gallbladder, no obstructing stone. Urinalysis with microhematuria. I offered admission if the patient feels poorly however he reports he feels very well, is tolerating by mouth intake. I discussed that the cause of his mild  pancreatitis is not clear to me and he needs to follow up quickly with his primary care doctor. We discussed return precautions and need for close PCP follow-up and he is comfortable with the discharge plan. DC home.  Clinical Course     ____________________________________________   FINAL CLINICAL IMPRESSION(S) / ED DIAGNOSES  Final diagnoses:  Pain  Epigastric pain  Acute pancreatitis, unspecified pancreatitis type      NEW MEDICATIONS STARTED DURING THIS VISIT:  New Prescriptions   No medications on file     Note:  This document was prepared using Dragon voice recognition software and may include unintentional dictation errors.    Gayla DossEryka A Jesyka Slaght, MD 01/26/16 [phone]

## 2016-01-26 NOTE — Discharge Instructions (Signed)
As we discussed, you have mild elevation of your lipase which represents pancreatitis though the exact cause of this is unclear. You Need to follow up with your primary care doctor as soon as possible for recheck. We also found a tinyamount of blood in your urine, your primary care doctor needs to recheck this within the week to ensure that it has resolved on its own. Return immediately to the emergency Department for severe orworsening abdominal pain, vomiting, blood in vomit or stool, fevers, chest pain or difficulty breathing or for any other consultants.

## 2016-01-26 NOTE — ED Triage Notes (Addendum)
Pt reports upper abdominal pain that started 2 days ago, denies N/V/D. [facility] for pancreat[facility] eval due to elevated lipase and amylase. Pt also with a hx of neurofibromatosis. [facility].

## 2016-04-17 DIAGNOSIS — F331 Major depressive disorder, recurrent, moderate: Secondary | ICD-10-CM | POA: Diagnosis not present

## 2017-03-20 ENCOUNTER — Encounter: Payer: Self-pay | Admitting: *Deleted

## 2017-03-20 ENCOUNTER — Emergency Department
Admission: EM | Admit: 2017-03-20 | Discharge: 2017-03-20 | Disposition: A | Discharge status: Home / Self Care | Payer: Medicare Other | Attending: Emergency Medicine | Admitting: Emergency Medicine

## 2017-03-20 DIAGNOSIS — Y929 Unspecified place or not applicable: Secondary | ICD-10-CM | POA: Diagnosis not present

## 2017-03-20 DIAGNOSIS — I10 Essential (primary) hypertension: Secondary | ICD-10-CM | POA: Diagnosis not present

## 2017-03-20 DIAGNOSIS — S4992XA Unspecified injury of left shoulder and upper arm, initial encounter: Secondary | ICD-10-CM | POA: Diagnosis present

## 2017-03-20 DIAGNOSIS — X58XXXA Exposure to other specified factors, initial encounter: Secondary | ICD-10-CM | POA: Insufficient documentation

## 2017-03-20 DIAGNOSIS — Y9389 Activity, other specified: Secondary | ICD-10-CM | POA: Insufficient documentation

## 2017-03-20 DIAGNOSIS — Y998 Other external cause status: Secondary | ICD-10-CM | POA: Diagnosis not present

## 2017-03-20 DIAGNOSIS — S46912A Strain of unspecified muscle, fascia and tendon at shoulder and upper arm level, left arm, initial encounter: Secondary | ICD-10-CM | POA: Diagnosis not present

## 2017-03-20 DIAGNOSIS — Q8501 Neurofibromatosis, type 1: Secondary | ICD-10-CM | POA: Diagnosis not present

## 2017-03-20 DIAGNOSIS — F329 Major depressive disorder, single episode, unspecified: Secondary | ICD-10-CM | POA: Insufficient documentation

## 2017-03-20 MED ORDER — NABUMETONE 750 MG PO TABS: 750.0000 mg | ORAL_TABLET | Freq: Two times a day (BID) | ORAL | 0 refills | Status: DC

## 2017-03-20 NOTE — ED Provider Notes (Signed)
[facility] Emergency Department Provider Note ____________________________________________  Time seen: 1511  I have reviewed the triage vital signs and the nursing notes.  HISTORY  Chief Complaint  Shoulder Injury  HPI Douglas Paul is a 26 y.o. male Presents to the ED for evaluation of intermittent left shoulder pain. Patient describes he believes he was seen here about 6 months ago, but review of his chart reveals a visit about 12 months ago to [facility] with complaints of left shoulder pain. He was subsequently referred to orthopedics, but admittedly, never made a follow-up appointment. He describes intermittent shoulder pain is aggravated by his work activities. 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 He denies any recent or previous injury to the left shoulder. He has not taken any medications in the interim for symptom relief. He is without distal paresthesias, grip changes, or chest pain.  Past Medical History:  Diagnosis Date  . Depression   . Hypertension   . Neurofibromatosis, type 1 (HCC)   . Partial blindness    right eye    There are no active problems to display for this patient.   Past Surgical History:  Procedure Laterality Date  . ABDOMINAL ADHESION SURGERY     tumors removed  . EYE SURGERY      Prior to Admission medications   Medication Sig Start Date End Date Taking? Authorizing Provider  nabumetone (RELAFEN) 750 MG tablet Take 1 tablet (750 mg total) by mouth 2 (two) times daily. 03/20/17   Avalin Briley, Charlesetta Ivory, PA-C  ondansetron (ZOFRAN ODT) 4 MG disintegrating tablet Take 1 tablet (4 mg total) by mouth every 8 (eight) hours as needed for nausea or vomiting. 01/26/16   Gayla Doss, MD  oxyCODONE (ROXICODONE) 5 MG immediate release tablet Take 1 tablet (5 mg total) by mouth every 6 (six) hours as needed for moderate pain. Do not drive while taking this medication. 01/26/16   Gayla Doss, MD    Allergies Patient has  no known allergies.  History reviewed. No pertinent family history.  Social History Social History  Substance Use Topics  . Smoking status: Never Smoker  . Smokeless tobacco: Never Used  . Alcohol use No    Review of Systems  Constitutional: Negative for fever. Musculoskeletal: Negative for back pain. Left shoulder pain as above. Skin: Negative for rash. Neurological: Negative for headaches, focal weakness or numbness. ____________________________________________  PHYSICAL EXAM:  VITAL SIGNS: ED Triage Vitals  Enc Vitals Group     BP 03/20/17 1440 (!) 146/92     Pulse Rate 03/20/17 1440 76     Resp 03/20/17 1440 18     Temp 03/20/17 1440 98.9 F (37.2 C)     Temp Source 03/20/17 1440 Oral     SpO2 03/20/17 1440 99 %     Weight 03/20/17 1440 151 lb (68.5 kg)     Height 03/20/17 1440  (1.753 m)     Head Circumference --      Peak Flow --      Pain Score 03/20/17 1437 0     Pain Loc --      Pain Edu? --      Excl. in GC? --     Constitutional: Alert and oriented. Well appearing and in no distress. Head: Normocephalic and atraumatic. Eyes: Conjunctivae are normal. Normal extraocular movements Neck: Supple. No thyromegaly. Cardiovascular: Normal rate, regular rhythm. Normal distal pulses. Respiratory: Normal respiratory effort. No wheezes/rales/rhonchi. Musculoskeletal: left  shoulder without any obvious deformity, dislocation, or sulcus sign. Patient with fully active range of motion to shoulder. No rotator cuff laxity or deficit is appreciated. Normal composite fist. Nontender with normal range of motion in all extremities.  Neurologic: cranial nerves II through XII grossly intact. Normal grips bilaterally. Normal gait without ataxia. Normal speech and language. No gross focal neurologic deficits are appreciated. ____________________________________________   RADIOLOGY  deferred ____________________________________________  INITIAL IMPRESSION / ASSESSMENT AND  PLAN / ED COURSE  DDX: dislocation, RC tendinitis, OA, bursitis  Patient to the ED with chronic, intermittent left shoulder pain. He has no recent injury or trauma. Prior evaluation within the [facility] system reveals negative shoulder x-rays and referral to orthopedics. The patient is discharged after be is consistent with a mild tendinitis with prescriptions for Relafen. He is also referred to orthopedics for further evaluation.  A work note is provided for today as requested. ____________________________________________  FINAL CLINICAL IMPRESSION(S) / ED DIAGNOSES  Final diagnoses:  Shoulder strain, left, initial encounter      Lissa Hoard, PA-C 03/20/17 1626    Rockne Menghini, MD 03/20/17 [phone]

## 2017-03-20 NOTE — ED Notes (Signed)
Pt reports that he has had a shoulder injury in the past. At work he does repetitive work with his arms and his neck shoulder and neck are hurting him.

## 2017-03-20 NOTE — Discharge Instructions (Signed)
Your exam is essentially normal today. Your previous x-rays are negative. You have some shoulder strain and mild tendinitis. Take the prescription med as directed. Do the exercises as provided. Follow-up with orthopedics for ongoing symptoms.

## 2017-03-20 NOTE — ED Triage Notes (Signed)
Pt states left shoulder injury, states hx of a "torn muscle" in that arm, states pain is only present when he is lifting things

## 2017-09-20 ENCOUNTER — Emergency Department: Payer: Medicare Other

## 2017-09-20 DIAGNOSIS — Z5321 Procedure and treatment not carried out due to patient leaving prior to being seen by health care provider: Secondary | ICD-10-CM | POA: Insufficient documentation

## 2017-09-20 DIAGNOSIS — R51 Headache: Secondary | ICD-10-CM | POA: Insufficient documentation

## 2017-09-20 NOTE — ED Triage Notes (Signed)
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

## 2017-09-20 NOTE — ED Triage Notes (Signed)
Patient c/o headache X 4 days. Patient reports bilateral hand tingling. Patient reports hx of previous headaches, however, reports this is different; it's more severe and has lasted longer than previous headaches.

## 2017-09-21 ENCOUNTER — Emergency Department
Admission: EM | Admit: 2017-09-21 | Discharge: 2017-09-21 | Disposition: A | Discharge status: Home / Self Care | Payer: Medicare Other | Attending: Emergency Medicine | Admitting: Emergency Medicine

## 2017-09-22 ENCOUNTER — Telehealth: Payer: Self-pay | Admitting: Emergency Medicine

## 2017-09-22 NOTE — Telephone Encounter (Signed)
Called patient due to lwot to inquire about condition and follow up plans.  No answer and no voicemail  

## 2017-09-26 ENCOUNTER — Emergency Department
Admission: EM | Admit: 2017-09-26 | Discharge: 2017-09-26 | Disposition: A | Discharge status: Home / Self Care | Payer: Medicare Other | Attending: Emergency Medicine | Admitting: Emergency Medicine

## 2017-09-26 ENCOUNTER — Encounter: Payer: Self-pay | Admitting: Emergency Medicine

## 2017-09-26 ENCOUNTER — Emergency Department: Payer: Medicare Other

## 2017-09-26 DIAGNOSIS — I10 Essential (primary) hypertension: Secondary | ICD-10-CM | POA: Diagnosis not present

## 2017-09-26 DIAGNOSIS — M25512 Pain in left shoulder: Secondary | ICD-10-CM | POA: Diagnosis not present

## 2017-09-26 MED ORDER — PREDNISONE 50 MG PO TABS: ORAL_TABLET | ORAL | 0 refills | Status: DC

## 2017-09-26 MED ORDER — METHOCARBAMOL 500 MG PO TABS: 500.0000 mg | ORAL_TABLET | Freq: Three times a day (TID) | ORAL | 0 refills | Status: AC | PRN

## 2017-09-26 NOTE — ED Provider Notes (Signed)
[facility] Emergency Department Provider Note  ____________________________________________  Time seen: Approximately 7:23 PM  I have reviewed the triage vital signs and the nursing notes.   HISTORY  Chief Complaint Shoulder Pain    HPI Douglas Paul is a 27 y.o. male presents to the emergency department with 6 out of 10 burning, left shoulder pain.  Patient reports that left shoulder pain has occurred for the past 3-5 days.  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  He denies weakness or changes in sensation of the upper extremities.  He denies falls or mechanisms of trauma.  Patient does have a history of neurofibromatosis.  No alleviating measures have been attempted.   Past Medical History:  Diagnosis Date  . Depression   . Hypertension   . Neurofibromatosis, type 1 (HCC)   . Partial blindness    right eye    There are no active problems to display for this patient.   Past Surgical History:  Procedure Laterality Date  . ABDOMINAL ADHESION SURGERY     tumors removed  . EYE SURGERY      Prior to Admission medications   Medication Sig Start Date End Date Taking? Authorizing Provider  methocarbamol (ROBAXIN) 500 MG tablet Take 1 tablet (500 mg total) by mouth 3 (three) times daily as needed for up to 5 days for muscle spasms. 09/26/17 10/01/17  Orvil Feil, PA-C  nabumetone (RELAFEN) 750 MG tablet Take 1 tablet (750 mg total) by mouth 2 (two) times daily. 03/20/17   Menshew, Charlesetta Ivory, PA-C  ondansetron (ZOFRAN ODT) 4 MG disintegrating tablet Take 1 tablet (4 mg total) by mouth every 8 (eight) hours as needed for nausea or vomiting. 01/26/16   Gayla Doss, MD  oxyCODONE (ROXICODONE) 5 MG immediate release tablet Take 1 tablet (5 mg total) by mouth every 6 (six) hours as needed for moderate pain. Do not drive while taking this medication. 01/26/16   Gayla Doss, MD   predniSONE (DELTASONE) 50 MG tablet Take one 50 mg tablet once daily for the next five days. 09/26/17   Orvil Feil, PA-C    Allergies Patient has no known allergies.  No family history on file.  Social History Social History   Tobacco Use  . Smoking status: Never Smoker  . Smokeless tobacco: Never Used  Substance Use Topics  . Alcohol use: No  . Drug use: Not on file     Review of Systems  Constitutional: No fever/chills Eyes: No visual changes. No discharge ENT: No upper respiratory complaints. Cardiovascular: no chest pain. Respiratory: no cough. No SOB. Gastrointestinal: No abdominal pain.  No nausea, no vomiting.  No diarrhea.  No constipation. Musculoskeletal: Patient has left shoulder pain. Skin: Negative for rash, abrasions, lacerations, ecchymosis. Neurological: Negative for headaches, focal weakness or numbness.   ____________________________________________   PHYSICAL EXAM:  VITAL SIGNS: ED Triage Vitals  Enc Vitals Group     BP 09/26/17 1825 (!) 160/109     Pulse Rate 09/26/17 1825 63     Resp 09/26/17 1825 18     Temp 09/26/17 1825 98.4 F (36.9 C)     Temp Source 09/26/17 1825 Oral     SpO2 09/26/17 1825 100 %     Weight 09/26/17 1824 152 lb (68.9 kg)     Height 09/26/17 1824 5\' 9"  (1.753 m)     Head Circumference --  Peak Flow --      Pain Score 09/26/17 1824 9     Pain Loc --      Pain Edu? --      Excl. in GC? --      Constitutional: Alert and oriented. Well appearing and in no acute distress. Eyes: Conjunctivae are normal. PERRL. EOMI. Head: Atraumatic. Cardiovascular: Normal rate, regular rhythm. Normal S1 and S2.  Good peripheral circulation. Respiratory: Normal respiratory effort without tachypnea or retractions. Lungs CTAB. Good air entry to the bases with no decreased or absent breath sounds. Musculoskeletal: Patient demonstrates full range of motion at the neck and left shoulder.  Patient does report increased discomfort  of left shoulder pain with range of motion at the neck.  No tenderness is elicited with palpation of the left upper trapezius.  No weakness is elicited with left rotator cuff testing.  Palpable dorsalis pedis pulse, left. Neurologic:  Normal speech and language. No gross focal neurologic deficits are appreciated.  Skin:  Skin is warm, dry and intact. No rash noted.  ____________________________________________   LABS (all labs ordered are listed, but only abnormal results are displayed)  Labs Reviewed - No data to display ____________________________________________  EKG   ____________________________________________  RADIOLOGY Geraldo PitterI, Kameela Leipold M Ellean Firman, personally viewed and evaluated these images (plain radiographs) as part of my medical decision making, as well as reviewing the written report by the radiologist.  Dg Shoulder Left  Result Date: 09/26/2017 CLINICAL DATA:  Pain EXAM: LEFT SHOULDER - 2+ VIEW COMPARISON:  None. FINDINGS: Frontal, Y scapular, and axillary images were obtained. No fracture or dislocation. Joint spaces appear normal. No erosive change. Visualized left lung clear. IMPRESSION: No fracture or dislocation.  No evident arthropathy. Electronically Signed   By: Bretta BangWilliam  Woodruff III M.D.   On: 09/26/2017 18:57    ____________________________________________    PROCEDURES  Procedure(s) performed:    Procedures    Medications - No data to display   ____________________________________________   INITIAL IMPRESSION / ASSESSMENT AND PLAN / ED COURSE  Pertinent labs & imaging results that were available during my care of the patient were reviewed by me and considered in my medical decision making (see chart for details).  Review of the [place] CSRS was performed in accordance of the [facility] prior to dispensing any controlled drugs.     Assessment and plan Left shoulder pain Patient presents to the emergency department with left shoulder pain that has worsened in  intensity over the past 3 days.  Differential diagnosis included rotator cuff tendinitis, muscle spasm and referred pain from the neck.  Physical exam findings are consistent with referred pain from the neck.  Patient was discharged with prednisone and Robaxin and advised to follow-up with primary care as needed.  All patient questions were answered.     ____________________________________________  FINAL CLINICAL IMPRESSION(S) / ED DIAGNOSES  Final diagnoses:  Acute pain of left shoulder      NEW MEDICATIONS STARTED DURING THIS VISIT:  ED Discharge Orders        Ordered    predniSONE (DELTASONE) 50 MG tablet     09/26/17 1916    methocarbamol (ROBAXIN) 500 MG tablet  3 times daily PRN     09/26/17 1916          This chart was dictated using voice recognition software/Dragon. Despite best efforts to proofread, errors can occur which can change the meaning. Any change was purely unintentional.    Orvil FeilWoods, Kenyette Gundy M, PA-C 09/26/17 1930  Arnaldo Natal, MD 09/26/17 2024

## 2017-09-26 NOTE — ED Triage Notes (Addendum)
Patient presents to the ED with left shoulder pain.  Patient states he has an old injury to his shoulder that seemed like it got worse during work.  Patient states his pain is worse at work.

## 2018-05-31 ENCOUNTER — Emergency Department
Admission: EM | Admit: 2018-05-31 | Discharge: 2018-05-31 | Disposition: A | Discharge status: Home / Self Care | Payer: Medicare HMO | Attending: Emergency Medicine | Admitting: Emergency Medicine

## 2018-05-31 ENCOUNTER — Other Ambulatory Visit: Payer: Self-pay

## 2018-05-31 ENCOUNTER — Emergency Department: Payer: Medicare HMO

## 2018-05-31 DIAGNOSIS — W108XXA Fall (on) (from) other stairs and steps, initial encounter: Secondary | ICD-10-CM | POA: Insufficient documentation

## 2018-05-31 DIAGNOSIS — M542 Cervicalgia: Secondary | ICD-10-CM | POA: Diagnosis not present

## 2018-05-31 DIAGNOSIS — I10 Essential (primary) hypertension: Secondary | ICD-10-CM | POA: Diagnosis not present

## 2018-05-31 DIAGNOSIS — W19XXXA Unspecified fall, initial encounter: Secondary | ICD-10-CM

## 2018-05-31 DIAGNOSIS — Z79899 Other long term (current) drug therapy: Secondary | ICD-10-CM | POA: Diagnosis not present

## 2018-05-31 DIAGNOSIS — Y92008 Other place in unspecified non-institutional (private) residence as the place of occurrence of the external cause: Secondary | ICD-10-CM | POA: Diagnosis not present

## 2018-05-31 DIAGNOSIS — Y999 Unspecified external cause status: Secondary | ICD-10-CM | POA: Diagnosis not present

## 2018-05-31 DIAGNOSIS — M549 Dorsalgia, unspecified: Secondary | ICD-10-CM | POA: Diagnosis not present

## 2018-05-31 DIAGNOSIS — Y9389 Activity, other specified: Secondary | ICD-10-CM | POA: Insufficient documentation

## 2018-05-31 DIAGNOSIS — M25512 Pain in left shoulder: Secondary | ICD-10-CM | POA: Insufficient documentation

## 2018-05-31 DIAGNOSIS — R51 Headache: Secondary | ICD-10-CM | POA: Diagnosis not present

## 2018-05-31 MED ORDER — IBUPROFEN 600 MG PO TABS
600.0000 mg | ORAL_TABLET | Freq: Once | ORAL | Status: AC
  Administered 2018-05-31: 600 mg via ORAL
  Filled 2018-05-31: qty 1

## 2018-05-31 MED ORDER — MELOXICAM 15 MG PO TABS: 15.0000 mg | ORAL_TABLET | Freq: Every day | ORAL | 1 refills | Status: AC

## 2018-05-31 NOTE — ED Notes (Signed)
Pt states yesterday he was at the top of his steps that broke causing him to fall  A couple steps backwards. Pt is c/o left shoulder blade pain and the back of his head has some sharp pains, denies loc, states that he has taken over the counter meds without relief, no abrasions or bruising noted to the shoulder blade, no injury seen on the posterior scalp. 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

## 2018-05-31 NOTE — ED Notes (Signed)
Pt fell yesterday and has c/o of lower back pain and head pain. Pt denies LOC. Pt denies vision changes and N/V.

## 2018-05-31 NOTE — ED Triage Notes (Signed)
Pt reports that last night he slipped on steps and fell injuring upper back and back of head - denies loss of consciousness - denies dizziness - denies N/V - denies blurred vision

## 2018-05-31 NOTE — ED Provider Notes (Signed)
[facility] Emergency Department Provider Note  ____________________________________________  Time seen: Approximately 7:00 PM  I have reviewed the triage vital signs and the nursing notes.   HISTORY  Chief Complaint Back Pain and Headache    HPI Douglas Paul is a 27 y.o. male presents to the emergency department after a fall that occurred yesterday.  Patient reports that the top step to his porch collapsed and he fell from standing height to the ground.  Patient reports that he hit the back of his head against the floor.  Patient denies loss of consciousness.  Patient reports that headache quickly resolved and he has had no vertigo, changes in vision, nausea or vomiting.  He does report 6 out of 10 aching neck pain and upper back pain without numbness or tingling in the upper or lower extremities.  He denies chest pain, shortness of breath, or abdominal pain.  He has been ambulating without difficulty.  Patient reports that he wants to be "checked out".   Past Medical History:  Diagnosis Date  . Depression   . Hypertension   . Neurofibromatosis, type 1 (HCC)   . Partial blindness    right eye    There are no active problems to display for this patient.   Past Surgical History:  Procedure Laterality Date  . ABDOMINAL ADHESION SURGERY     tumors removed  . EYE SURGERY      Prior to Admission medications   Medication Sig Start Date End Date Taking? Authorizing Provider  meloxicam (MOBIC) 15 MG tablet Take 1 tablet (15 mg total) by mouth daily for 7 days. 05/31/18 06/07/18  Orvil FeilWoods, Beatris Belen M, PA-C  nabumetone (RELAFEN) 750 MG tablet Take 1 tablet (750 mg total) by mouth 2 (two) times daily. 03/20/17   Menshew, Charlesetta IvoryJenise V Bacon, PA-C  ondansetron (ZOFRAN ODT) 4 MG disintegrating tablet Take 1 tablet (4 mg total) by mouth every 8 (eight) hours as needed for nausea or vomiting. 01/26/16   Gayla DossGayle, Eryka A, MD  oxyCODONE (ROXICODONE) 5 MG immediate release tablet  Take 1 tablet (5 mg total) by mouth every 6 (six) hours as needed for moderate pain. Do not drive while taking this medication. 01/26/16   Gayla DossGayle, Eryka A, MD  predniSONE (DELTASONE) 50 MG tablet Take one 50 mg tablet once daily for the next five days. 09/26/17   Orvil FeilWoods, Rufus Cypert M, PA-C    Allergies Patient has no known allergies.  No family history on file.  Social History Social History   Tobacco Use  . Smoking status: Never Smoker  . Smokeless tobacco: Never Used  Substance Use Topics  . Alcohol use: No  . Drug use: Never     Review of Systems  Constitutional: No fever/chills Eyes: No visual changes. No discharge ENT: No upper respiratory complaints. Cardiovascular: no chest pain. Respiratory: no cough. No SOB. Gastrointestinal: No abdominal pain.  No nausea, no vomiting.  No diarrhea.  No constipation. Musculoskeletal: Patient has neck pain and upper back pain. Skin: Negative for rash, abrasions, lacerations, ecchymosis. Neurological: Negative for headaches, focal weakness or numbness.   ____________________________________________   PHYSICAL EXAM:  VITAL SIGNS: ED Triage Vitals  Enc Vitals Group     BP 05/31/18 1651 (!) 163/97     Pulse Rate 05/31/18 1651 80     Resp 05/31/18 1651 15     Temp 05/31/18 1651 98.3 F (36.8 C)     Temp Source 05/31/18 1651 Oral     SpO2 05/31/18  1651 100 %     Weight 05/31/18 1652 155 lb (70.3 kg)     Height 05/31/18 1652 5\' 9"  (1.753 m)     Head Circumference --      Peak Flow --      Pain Score 05/31/18 1652 7     Pain Loc --      Pain Edu? --      Excl. in GC? --      Constitutional: Alert and oriented. Well appearing and in no acute distress. Eyes: Conjunctivae are normal. PERRL. EOMI. Accommodation intact.  Peripheral vision intact. Head: Atraumatic. ENT:      Ears: TMs are pearly.      Nose: No congestion/rhinnorhea.      Mouth/Throat: Mucous membranes are moist.  Neck: No stridor.  Full range of motion with no  midline C-spine tenderness.  Patient has paraspinal muscle tenderness along the cervical spine.  Cardiovascular: Normal rate, regular rhythm. Normal S1 and S2.  Good peripheral circulation. Respiratory: Normal respiratory effort without tachypnea or retractions. Lungs CTAB. Good air entry to the bases with no decreased or absent breath sounds. Gastrointestinal: Bowel sounds 4 quadrants. Soft and nontender to palpation. No guarding or rigidity. No palpable masses. No distention. No CVA tenderness. Musculoskeletal: Full range of motion to all extremities. No gross deformities appreciated.  Patient has paraspinal muscle tenderness along the thoracic spine. Neurologic:  Normal speech and language. No gross focal neurologic deficits are appreciated.  No hypo-or hyperreflexia.  Patient can perform rapid alternating movements and hand to nose.  Patient can perform heel-to-toe.  Negative Romberg. Skin:  Skin is warm, dry and intact. No rash noted. Psychiatric: Mood and affect are normal. Speech and behavior are normal. Patient exhibits appropriate insight and judgement.   ____________________________________________   LABS (all labs ordered are listed, but only abnormal results are displayed)  Labs Reviewed - No data to display ____________________________________________  EKG   ____________________________________________  RADIOLOGY  I personally viewed and evaluated these images as part of my medical decision making, as well as reviewing the written report by the radiologist.    Dg Cervical Spine 2-3 Views  Result Date: 05/31/2018 CLINICAL DATA:  Fall EXAM: CERVICAL SPINE - 2-3 VIEW COMPARISON:  None. FINDINGS: There is no evidence of cervical spine fracture or prevertebral soft tissue swelling. Alignment is normal. No other significant bone abnormalities are identified. IMPRESSION: Negative cervical spine radiographs. Electronically Signed   By: Charlett Nose M.D.   On: 05/31/2018 19:27    Dg Thoracic Spine 2 View  Result Date: 05/31/2018 CLINICAL DATA:  Fall.  Left shoulder pain, back pain EXAM: THORACIC SPINE 2 VIEWS COMPARISON:  None. FINDINGS: Leftward scoliosis in the upper thoracic spine and rightward scoliosis in the lower thoracic spine. No fracture. Disc spaces maintained. No focal bone abnormality. IMPRESSION: Scoliosis.  No acute bony abnormality. Electronically Signed   By: Charlett Nose M.D.   On: 05/31/2018 19:28    ____________________________________________    PROCEDURES  Procedure(s) performed:    Procedures    Medications  ibuprofen (ADVIL,MOTRIN) tablet 600 mg (600 mg Oral Given 05/31/18 1921)     ____________________________________________   INITIAL IMPRESSION / ASSESSMENT AND PLAN / ED COURSE  Pertinent labs & imaging results that were available during my care of the patient were reviewed by me and considered in my medical decision making (see chart for details).  Review of the [place] CSRS was performed in accordance of the [facility] prior to dispensing any controlled drugs.  Assessment and plan Fall Patient presents to the emergency department after a fall that occurred 1 day ago.  Patient reports neck pain and upper back pain.  X-ray examination of the cervical and thoracic spine revealed no acute bony abnormality.  Neurologic exam and overall physical exam was reassuring.  Patient has no headache, blurry vision, nausea, vomiting or other neuro deficits that would warrant a CT head.  Patient was discharged with meloxicam for pain.  Patient was advised to follow-up with primary care as needed.    ____________________________________________  FINAL CLINICAL IMPRESSION(S) / ED DIAGNOSES  Final diagnoses:  Fall, initial encounter      NEW MEDICATIONS STARTED DURING THIS VISIT:  ED Discharge Orders         Ordered    meloxicam (MOBIC) 15 MG tablet  Daily     05/31/18 1952              This chart was dictated using  voice recognition software/Dragon. Despite best efforts to proofread, errors can occur which can change the meaning. Any change was purely unintentional.    Orvil Feil, PA-C 05/31/18 1956    Jene Every, MD 05/31/18 2002

## 2018-06-08 ENCOUNTER — Other Ambulatory Visit: Payer: Self-pay

## 2018-06-08 ENCOUNTER — Encounter: Payer: Self-pay | Admitting: Emergency Medicine

## 2018-06-08 ENCOUNTER — Emergency Department
Admission: EM | Admit: 2018-06-08 | Discharge: 2018-06-08 | Disposition: A | Discharge status: Home / Self Care | Payer: Medicare HMO | Attending: Emergency Medicine | Admitting: Emergency Medicine

## 2018-06-08 ENCOUNTER — Emergency Department: Payer: Medicare HMO

## 2018-06-08 DIAGNOSIS — S93402A Sprain of unspecified ligament of left ankle, initial encounter: Secondary | ICD-10-CM | POA: Diagnosis not present

## 2018-06-08 DIAGNOSIS — X509XXA Other and unspecified overexertion or strenuous movements or postures, initial encounter: Secondary | ICD-10-CM | POA: Insufficient documentation

## 2018-06-08 DIAGNOSIS — Y929 Unspecified place or not applicable: Secondary | ICD-10-CM | POA: Diagnosis not present

## 2018-06-08 DIAGNOSIS — S99912A Unspecified injury of left ankle, initial encounter: Secondary | ICD-10-CM | POA: Diagnosis present

## 2018-06-08 DIAGNOSIS — Y998 Other external cause status: Secondary | ICD-10-CM | POA: Insufficient documentation

## 2018-06-08 DIAGNOSIS — Y9389 Activity, other specified: Secondary | ICD-10-CM | POA: Insufficient documentation

## 2018-06-08 DIAGNOSIS — S90812A Abrasion, left foot, initial encounter: Secondary | ICD-10-CM | POA: Insufficient documentation

## 2018-06-08 DIAGNOSIS — I10 Essential (primary) hypertension: Secondary | ICD-10-CM | POA: Insufficient documentation

## 2018-06-08 MED ORDER — IBUPROFEN 600 MG PO TABS
600.0000 mg | ORAL_TABLET | Freq: Once | ORAL | Status: AC
  Administered 2018-06-08: 600 mg via ORAL
  Filled 2018-06-08: qty 1

## 2018-06-08 MED ORDER — BACITRACIN-NEOMYCIN-POLYMYXIN 400-5-5000 EX OINT
TOPICAL_OINTMENT | Freq: Once | CUTANEOUS | Status: AC
  Administered 2018-06-08: 1 via TOPICAL
  Filled 2018-06-08: qty 1

## 2018-06-08 MED ORDER — TRAMADOL HCL 50 MG PO TABS
50.0000 mg | ORAL_TABLET | Freq: Once | ORAL | Status: AC
  Administered 2018-06-08: 50 mg via ORAL
  Filled 2018-06-08: qty 1

## 2018-06-08 MED ORDER — IBUPROFEN 600 MG PO TABS: 600.0000 mg | ORAL_TABLET | Freq: Three times a day (TID) | ORAL | 0 refills | Status: DC | PRN

## 2018-06-08 MED ORDER — TRAMADOL HCL 50 MG PO TABS: 50.0000 mg | ORAL_TABLET | Freq: Two times a day (BID) | ORAL | 0 refills | Status: DC | PRN

## 2018-06-08 NOTE — ED Notes (Addendum)
See triage note  Twisted left ankle on Saturday   States he missed a step  Good pulses no deformity

## 2018-06-08 NOTE — Discharge Instructions (Addendum)
Follow discharge care instruction and wear Ace wrap for 2 to 3 days as needed.

## 2018-06-08 NOTE — ED Triage Notes (Signed)
Pt arrived via POV with reports of twisting his ankle on Saturday, pt states he missed the step and twisted his ankle, pt reports swelling present, declined wheelchair on arrival.
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
## 2018-07-22 ENCOUNTER — Emergency Department
Admission: EM | Admit: 2018-07-22 | Discharge: 2018-07-22 | Disposition: A | Discharge status: Home / Self Care | Payer: Medicare HMO | Attending: Emergency Medicine | Admitting: Emergency Medicine

## 2018-07-22 ENCOUNTER — Other Ambulatory Visit: Payer: Self-pay

## 2018-07-22 DIAGNOSIS — I10 Essential (primary) hypertension: Secondary | ICD-10-CM | POA: Diagnosis not present

## 2018-07-22 DIAGNOSIS — R05 Cough: Secondary | ICD-10-CM | POA: Insufficient documentation

## 2018-07-22 DIAGNOSIS — R059 Cough, unspecified: Secondary | ICD-10-CM

## 2018-07-22 MED ORDER — FLUTICASONE PROPIONATE 50 MCG/ACT NA SUSP: 2.0000 | Freq: Every day | NASAL | 0 refills | Status: DC

## 2018-07-22 MED ORDER — ALBUTEROL SULFATE HFA 108 (90 BASE) MCG/ACT IN AERS: 2.0000 | INHALATION_SPRAY | Freq: Four times a day (QID) | RESPIRATORY_TRACT | 0 refills | Status: AC | PRN

## 2018-07-22 MED ORDER — BENZONATATE 100 MG PO CAPS: ORAL_CAPSULE | ORAL | 0 refills | Status: DC

## 2018-07-22 NOTE — Discharge Instructions (Signed)
Take your prescriptions as directed. Consider OTC Delsym for additional cough relief. Follow-up with Dr. Ellsworth Lennox for ongoing symptoms.
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
## 2018-07-22 NOTE — ED Triage Notes (Signed)
Pt in with co coughing for 2 days denies any fever.

## 2019-02-28 ENCOUNTER — Other Ambulatory Visit: Payer: Self-pay

## 2019-02-28 ENCOUNTER — Emergency Department
Admission: EM | Admit: 2019-02-28 | Discharge: 2019-02-28 | Disposition: A | Discharge status: Home / Self Care | Payer: Medicare HMO | Attending: Emergency Medicine | Admitting: Emergency Medicine

## 2019-02-28 ENCOUNTER — Emergency Department: Payer: Medicare HMO

## 2019-02-28 DIAGNOSIS — R29898 Other symptoms and signs involving the musculoskeletal system: Secondary | ICD-10-CM | POA: Diagnosis not present

## 2019-02-28 DIAGNOSIS — Z79899 Other long term (current) drug therapy: Secondary | ICD-10-CM | POA: Diagnosis not present

## 2019-02-28 DIAGNOSIS — M79631 Pain in right forearm: Secondary | ICD-10-CM | POA: Insufficient documentation

## 2019-02-28 DIAGNOSIS — I10 Essential (primary) hypertension: Secondary | ICD-10-CM | POA: Insufficient documentation

## 2019-02-28 DIAGNOSIS — M79601 Pain in right arm: Secondary | ICD-10-CM

## 2019-02-28 MED ORDER — MELOXICAM 15 MG PO TABS: 15.0000 mg | ORAL_TABLET | Freq: Every day | ORAL | 1 refills | Status: AC

## 2019-02-28 NOTE — ED Triage Notes (Signed)
Pt states since Friday he has felt "popping" to right forearm when lifting items. Pt states pain only when lifting, denies pain when at rest. Pt appears in no acute distress, no obvious injury noted, no swelling noted.

## 2019-02-28 NOTE — ED Notes (Signed)
Patient denies pain.
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
## 2019-08-14 ENCOUNTER — Emergency Department
Admission: EM | Admit: 2019-08-14 | Discharge: 2019-08-14 | Disposition: A | Discharge status: Home / Self Care | Payer: Medicare Other | Attending: Emergency Medicine | Admitting: Emergency Medicine

## 2019-08-14 ENCOUNTER — Emergency Department: Payer: Medicare Other

## 2019-08-14 ENCOUNTER — Other Ambulatory Visit: Payer: Self-pay

## 2019-08-14 ENCOUNTER — Encounter: Payer: Self-pay | Admitting: Emergency Medicine

## 2019-08-14 DIAGNOSIS — Q8501 Neurofibromatosis, type 1: Secondary | ICD-10-CM | POA: Insufficient documentation

## 2019-08-14 DIAGNOSIS — Y9389 Activity, other specified: Secondary | ICD-10-CM | POA: Insufficient documentation

## 2019-08-14 DIAGNOSIS — Y999 Unspecified external cause status: Secondary | ICD-10-CM | POA: Diagnosis not present

## 2019-08-14 DIAGNOSIS — Y92410 Unspecified street and highway as the place of occurrence of the external cause: Secondary | ICD-10-CM | POA: Diagnosis not present

## 2019-08-14 DIAGNOSIS — M546 Pain in thoracic spine: Secondary | ICD-10-CM | POA: Diagnosis present

## 2019-08-14 DIAGNOSIS — M7918 Myalgia, other site: Secondary | ICD-10-CM | POA: Insufficient documentation

## 2019-08-14 DIAGNOSIS — R519 Headache, unspecified: Secondary | ICD-10-CM | POA: Diagnosis not present

## 2019-08-14 MED ORDER — NAPROXEN 500 MG PO TABS: 500.0000 mg | ORAL_TABLET | Freq: Two times a day (BID) | ORAL | 0 refills | Status: DC
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
## 2019-09-05 IMAGING — DX DG ANKLE COMPLETE 3+V*L*
3 series · 3 of 3 positions shown · non-contrast
Comparison: None

CLINICAL DATA: Twisted ankle on [REDACTED] when he missed a step,
pain and swelling

EXAM:
LEFT ANKLE COMPLETE - 3+ VIEW

[ankle ap]
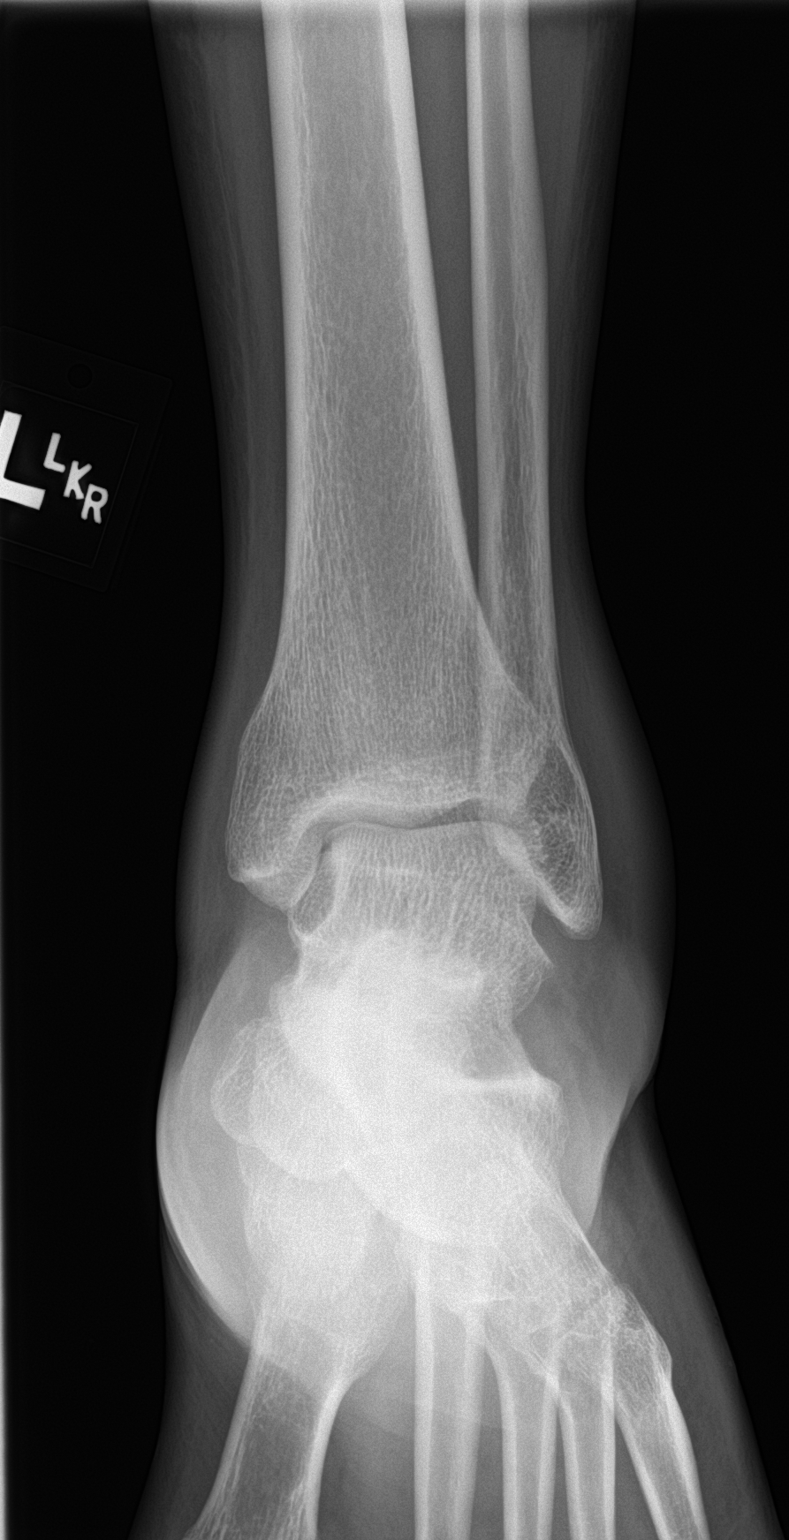

[ankle obl]
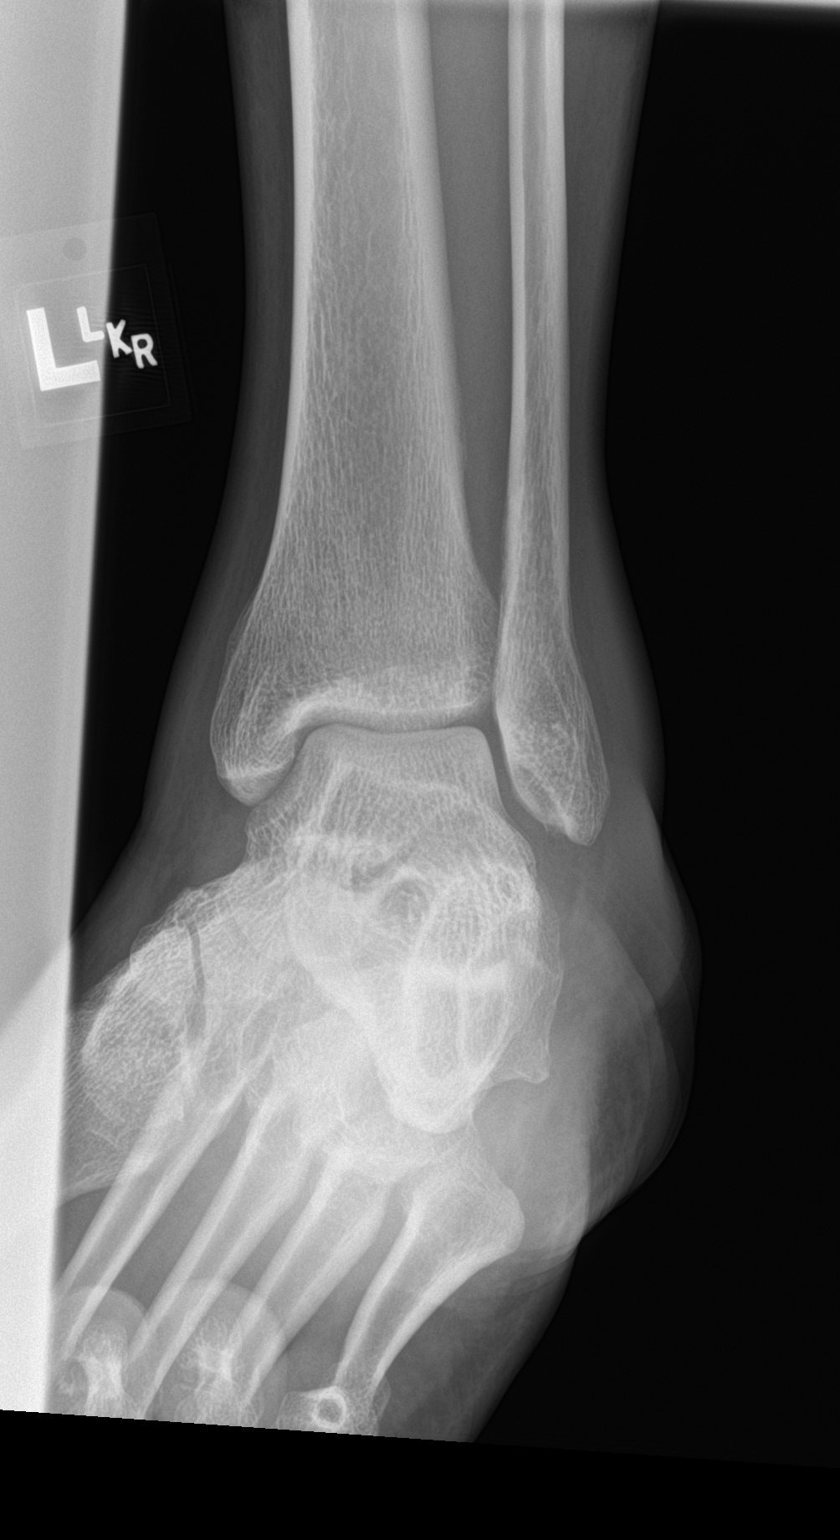

[ankle lat]
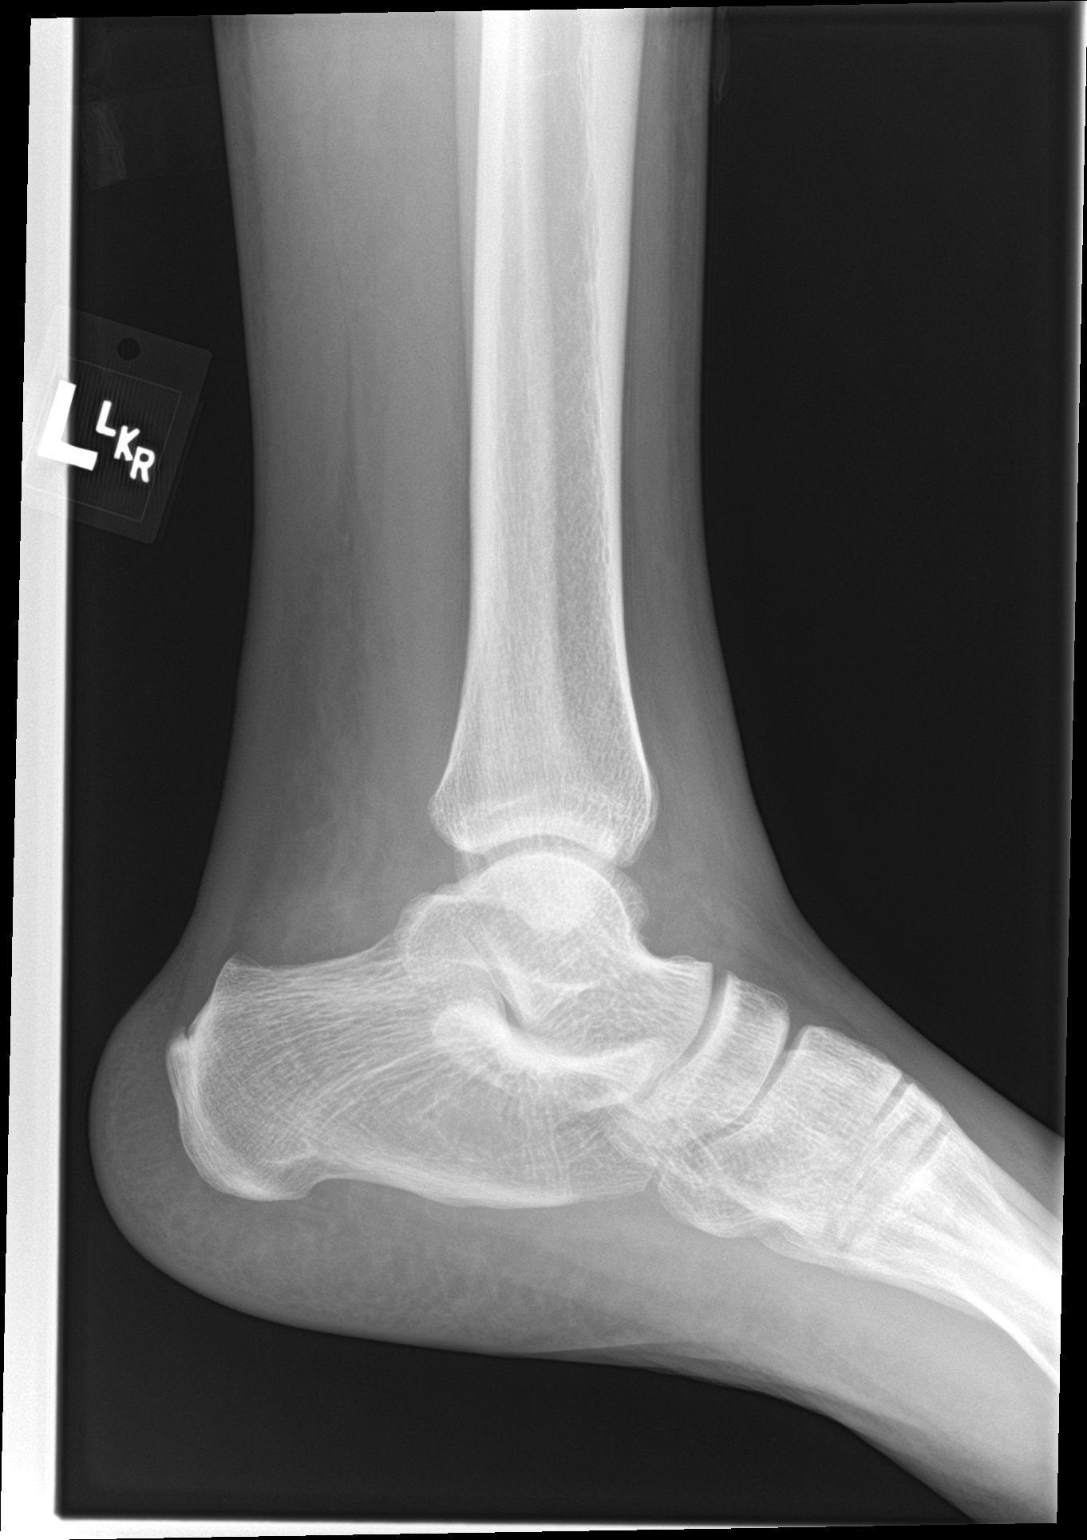

[3 of 3 positions shown; findings below may reference images not displayed]

FINDINGS: Soft tissue swelling, greatest laterally.

Osseous mineralization normal.

Joint spaces preserved.

No acute fracture, dislocation, or bone destruction.
IMPRESSION: Soft tissue swelling without acute bony abnormalities.

## 2019-10-14 ENCOUNTER — Other Ambulatory Visit: Payer: Self-pay

## 2019-10-14 ENCOUNTER — Encounter: Payer: Self-pay | Admitting: Emergency Medicine

## 2019-10-14 ENCOUNTER — Emergency Department
Admission: EM | Admit: 2019-10-14 | Discharge: 2019-10-14 | Disposition: A | Discharge status: Home / Self Care | Payer: Medicare Other | Attending: Emergency Medicine | Admitting: Emergency Medicine

## 2019-10-14 DIAGNOSIS — J302 Other seasonal allergic rhinitis: Secondary | ICD-10-CM | POA: Diagnosis not present

## 2019-10-14 DIAGNOSIS — R0981 Nasal congestion: Secondary | ICD-10-CM | POA: Diagnosis present

## 2019-10-14 DIAGNOSIS — I10 Essential (primary) hypertension: Secondary | ICD-10-CM | POA: Insufficient documentation

## 2019-10-14 MED ORDER — FLUTICASONE PROPIONATE 50 MCG/ACT NA SUSP: 2.0000 | Freq: Every day | NASAL | 0 refills | Status: AC

## 2019-10-14 MED ORDER — CETIRIZINE HCL 10 MG PO TABS: 10.0000 mg | ORAL_TABLET | Freq: Every day | ORAL | 2 refills | Status: AC

## 2019-10-14 NOTE — ED Notes (Signed)
See triage note  Presents with some nausea,nasal congestion and weakness  States sx's started this week   Denies any fever and is currently afebrile
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
## 2019-10-14 NOTE — ED Triage Notes (Signed)
Patient ambulatory to triage with steady gait, without difficulty or distress noted, mask in place; pt reports since yesterday having nasal congestion and nausea

## 2019-10-27 ENCOUNTER — Emergency Department: Payer: Worker's Compensation

## 2019-10-27 ENCOUNTER — Other Ambulatory Visit: Payer: Self-pay

## 2019-10-27 ENCOUNTER — Emergency Department
Admission: EM | Admit: 2019-10-27 | Discharge: 2019-10-27 | Disposition: A | Discharge status: Home / Self Care | Payer: Worker's Compensation | Attending: Emergency Medicine | Admitting: Emergency Medicine

## 2019-10-27 DIAGNOSIS — Y9389 Activity, other specified: Secondary | ICD-10-CM | POA: Diagnosis not present

## 2019-10-27 DIAGNOSIS — S63501A Unspecified sprain of right wrist, initial encounter: Secondary | ICD-10-CM | POA: Diagnosis not present

## 2019-10-27 DIAGNOSIS — Z79899 Other long term (current) drug therapy: Secondary | ICD-10-CM | POA: Insufficient documentation

## 2019-10-27 DIAGNOSIS — Y990 Civilian activity done for income or pay: Secondary | ICD-10-CM | POA: Insufficient documentation

## 2019-10-27 DIAGNOSIS — I10 Essential (primary) hypertension: Secondary | ICD-10-CM | POA: Insufficient documentation

## 2019-10-27 DIAGNOSIS — S6991XA Unspecified injury of right wrist, hand and finger(s), initial encounter: Secondary | ICD-10-CM | POA: Diagnosis present

## 2019-10-27 DIAGNOSIS — X500XXA Overexertion from strenuous movement or load, initial encounter: Secondary | ICD-10-CM | POA: Diagnosis not present

## 2019-10-27 DIAGNOSIS — Y9289 Other specified places as the place of occurrence of the external cause: Secondary | ICD-10-CM | POA: Insufficient documentation

## 2019-10-27 MED ORDER — NAPROXEN 500 MG PO TABS: 500.0000 mg | ORAL_TABLET | Freq: Two times a day (BID) | ORAL | 0 refills | Status: DC

## 2019-10-27 NOTE — ED Notes (Signed)
See triage note  Presents with pain to right wrist states he felt a "pop" to right forearm while lifting boxes  No deformity noted  Good pulses
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
## 2019-10-27 NOTE — Discharge Instructions (Signed)
Follow-up with Dr. Odis Luster if any continued problems with your wrist are not improving.  Ice and elevate to reduce swelling and help with pain.  Also begin taking naproxen 500 mg twice daily with food.  This is for inflammation and pain.  You may take Tylenol with this medication if additional pain medication is needed.  Wear the wrist splint for support and protection.  You will still be able to move your fingers and use your hand.  Limited work note was written.  If the company is unable to provide these restrictions they may send you home.

## 2019-10-27 NOTE — ED Triage Notes (Signed)
Pt states he thinks he sprained his right wrist while at work today.

## 2020-10-18 ENCOUNTER — Emergency Department
Admission: EM | Admit: 2020-10-18 | Discharge: 2020-10-18 | Disposition: A | Discharge status: Home / Self Care | Payer: Medicare HMO | Attending: Student in an Organized Health Care Education/Training Program | Admitting: Student in an Organized Health Care Education/Training Program

## 2020-10-18 ENCOUNTER — Other Ambulatory Visit: Payer: Self-pay

## 2020-10-18 ENCOUNTER — Emergency Department: Payer: Medicare HMO

## 2020-10-18 DIAGNOSIS — I10 Essential (primary) hypertension: Secondary | ICD-10-CM | POA: Diagnosis not present

## 2020-10-18 DIAGNOSIS — R519 Headache, unspecified: Secondary | ICD-10-CM | POA: Insufficient documentation

## 2020-10-18 LAB — COMPREHENSIVE METABOLIC PANEL
ALT: 28 U/L (ref 0–44)
AST: 27 U/L (ref 15–41)
Albumin: 4.2 g/dL (ref 3.5–5.0)
Alkaline Phosphatase: 57 U/L (ref 38–126)
Anion gap: 6 (ref 5–15)
BUN: 11 mg/dL (ref 6–20)
CO2: 27 mmol/L (ref 22–32)
Calcium: 9.6 mg/dL (ref 8.9–10.3)
Chloride: 106 mmol/L (ref 98–111)
Creatinine, Ser: 1.17 mg/dL (ref 0.61–1.24)
GFR, Estimated: >60 mL/min (ref >60)
Glucose, Bld: 89 mg/dL (ref 70–99)
Potassium: 4.4 mmol/L (ref 3.5–5.1)
Sodium: 139 mmol/L (ref 135–145)
Total Bilirubin: 0.7 mg/dL (ref 0.3–1.2)
Total Protein: 7.9 g/dL (ref 6.5–8.1)

## 2020-10-18 LAB — CBC WITH DIFFERENTIAL/PLATELET
Abs Immature Granulocytes: 0.01 10*3/uL (ref 0.00–0.07)
Basophils Absolute: 0 10*3/uL (ref 0.0–0.1)
Basophils Relative: 1 %
Eosinophils Absolute: 0.1 10*3/uL (ref 0.0–0.5)
Eosinophils Relative: 2 %
HCT: 46.2 % (ref 39.0–52.0)
Hemoglobin: 13.9 g/dL (ref 13.0–17.0)
Immature Granulocytes: 0 %
Lymphocytes Relative: 19 %
Lymphs Abs: 1.2 10*3/uL (ref 0.7–4.0)
MCH: 23.4 pg — ABNORMAL LOW (ref 26.0–34.0)
MCHC: 30.1 g/dL (ref 30.0–36.0)
MCV: 77.8 fL — ABNORMAL LOW (ref 80.0–100.0)
Monocytes Absolute: 0.6 10*3/uL (ref 0.1–1.0)
Monocytes Relative: 10 %
Neutro Abs: 4.4 10*3/uL (ref 1.7–7.7)
Neutrophils Relative %: 68 %
Platelets: 156 10*3/uL (ref 150–400)
RBC: 5.94 MIL/uL — ABNORMAL HIGH (ref 4.22–5.81)
RDW: 13.2 % (ref 11.5–15.5)
WBC: 6.3 10*3/uL (ref 4.0–10.5)
nRBC: 0 % (ref 0.0–0.2)

## 2020-10-18 MED ORDER — KETOROLAC TROMETHAMINE 30 MG/ML IJ SOLN
30.0000 mg | Freq: Once | INTRAMUSCULAR | Status: AC
  Administered 2020-10-18: 30 mg via INTRAVENOUS
  Filled 2020-10-18: qty 1

## 2020-10-18 MED ORDER — METOCLOPRAMIDE HCL 5 MG/ML IJ SOLN
10.0000 mg | Freq: Once | INTRAMUSCULAR | Status: AC
  Administered 2020-10-18: 10 mg via INTRAVENOUS
  Filled 2020-10-18: qty 2

## 2020-10-18 MED ORDER — IOHEXOL 300 MG/ML  SOLN
75.0000 mL | Freq: Once | INTRAMUSCULAR | Status: AC | PRN
  Administered 2020-10-18: 75 mL via INTRAVENOUS
  Filled 2020-10-18: qty 75

## 2020-10-18 MED ORDER — DIPHENHYDRAMINE HCL 50 MG/ML IJ SOLN
50.0000 mg | Freq: Once | INTRAMUSCULAR | Status: AC
  Administered 2020-10-18: 50 mg via INTRAVENOUS
  Filled 2020-10-18: qty 1

## 2020-10-18 MED ORDER — SODIUM CHLORIDE 0.9 % IV BOLUS
1000.0000 mL | Freq: Once | INTRAVENOUS | Status: AC
  Administered 2020-10-18: 1000 mL via INTRAVENOUS

## 2020-10-18 NOTE — ED Triage Notes (Signed)
Pt comes with c/o headache for two weeks. Pt denies any N/V/D.  Pt states hx of headaches. Pt states little relief with OTC meds.

## 2020-10-18 NOTE — ED Provider Notes (Signed)
[facility]-EMERGENCY DEPARTMENT  ____________________________________________  Time seen: Approximately 5:15 PM  I have reviewed the triage vital signs and the nursing notes.   HISTORY  Chief Complaint Headache   Historian Patient     HPI Douglas Paul is a 30 y.o. male presents to the emergency department with headache for the past 2 weeks.  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  Patient states that he last had imaging in September 2021 with no changes in the sizes of his brain lesions.  He denies weight loss or weight gain.  No night sweats.  He denies fever or other new constitutional symptoms.  He reports that his last eye exam was several months ago with no changes.  He reports that his headache came on slowly and has been consistent in terms of strength.  He denies photophobia or phonophobia.  He characterizes the headache as occipital that wraps around to the front of his head.  No falls or mechanisms of trauma.  He states that he does not take prescribed medications for his headaches.  States that headaches normally resolve well with Tylenol.  He states that he has had similar headaches in the past and that this headache is not atypical other than duration.   Past Medical History:  Diagnosis Date  . Depression   . Hypertension   . Neurofibromatosis, type 1 (HCC)   . Partial blindness    right eye     Immunizations up to date:  Yes.     Past Medical History:  Diagnosis Date  . Depression   . Hypertension   . Neurofibromatosis, type 1 (HCC)   . Partial blindness    right eye    There are no problems to display for this patient.   Past Surgical History:  Procedure Laterality Date  . ABDOMINAL ADHESION SURGERY     tumors removed  . EYE SURGERY      Prior to Admission medications   Medication Sig Start Date End Date Taking? Authorizing Provider  albuterol (PROVENTIL HFA;VENTOLIN  HFA) 108 (90 Base) MCG/ACT inhaler Inhale 2 puffs into the lungs every 6 (six) hours as needed for wheezing or shortness of breath. 07/22/18   Menshew, Charlesetta Ivory, PA-C  cetirizine (ZYRTEC ALLERGY) 10 MG tablet Take 1 tablet (10 mg total) by mouth daily. 10/14/19 10/13/20  Triplett, Kasandra Knudsen, FNP  fluticasone (FLONASE) 50 MCG/ACT nasal spray Place 2 sprays into both nostrils daily. 10/14/19   Triplett, Rulon Eisenmenger B, FNP  naproxen (NAPROSYN) 500 MG tablet Take 1 tablet (500 mg total) by mouth 2 (two) times daily with a meal. 10/27/19   Tommi Rumps, PA-C    Allergies Patient has no known allergies.  No family history on file.  Social History Social History   Tobacco Use  . Smoking status: Never Smoker  . Smokeless tobacco: Never Used  Vaping Use  . Vaping Use: Never used  Substance Use Topics  . Alcohol use: No  . Drug use: Never     Review of Systems  Constitutional: No fever/chills Eyes:  No discharge ENT: No upper respiratory complaints. Respiratory: no cough. No SOB/ use of accessory muscles to breath Gastrointestinal:   No nausea, no vomiting.  No diarrhea.  No constipation. Musculoskeletal: Negative for musculoskeletal pain. Skin: Negative for rash, abrasions, lacerations, ecchymosis.   ____________________________________________   PHYSICAL EXAM:  VITAL SIGNS: ED Triage Vitals  Enc Vitals Group  BP 10/18/20 1613 (!) 159/102     Pulse Rate 10/18/20 1613 68     Resp 10/18/20 1613 18     Temp 10/18/20 1613 98.3 F (36.8 C)     Temp Source 10/18/20 1613 Oral     SpO2 10/18/20 1613 98 %     Weight 10/18/20 1613 178 lb (80.7 kg)     Height 10/18/20 1613 5\' 9"  (1.753 m)     Head Circumference --      Peak Flow --      Pain Score 10/18/20 1613 8     Pain Loc --      Pain Edu? --      Excl. in GC? --      Constitutional: Alert and oriented. Well appearing and in no acute distress. Eyes: Conjunctivae are normal. PERRL. EOMI. Head: Atraumatic. ENT:      Nose:  No congestion/rhinnorhea.      Mouth/Throat: Mucous membranes are moist.  Neck: No stridor. FROM.  Cardiovascular: Normal rate, regular rhythm. Normal S1 and S2.  Good peripheral circulation. Respiratory: Normal respiratory effort without tachypnea or retractions. Lungs CTAB. Good air entry to the bases with no decreased or absent breath sounds Gastrointestinal: Bowel sounds x 4 quadrants. Soft and nontender to palpation. No guarding or rigidity. No distention. Musculoskeletal: Full range of motion to all extremities. No obvious deformities noted Neurologic:  Normal for age. No gross focal neurologic deficits are appreciated.  Skin:  Skin is warm, dry and intact. No rash noted. Psychiatric: Mood and affect are normal for age. Speech and behavior are normal.   ____________________________________________   LABS (all labs ordered are listed, but only abnormal results are displayed)  Labs Reviewed  CBC WITH DIFFERENTIAL/PLATELET - Abnormal; Notable for the following components:      Result Value   RBC 5.94 (*)    MCV 77.8 (*)    MCH 23.4 (*)    All other components within normal limits  COMPREHENSIVE METABOLIC PANEL   ____________________________________________  EKG   ____________________________________________  RADIOLOGY 12/18/20, personally viewed and evaluated these images (plain radiographs) as part of my medical decision making, as well as reviewing the written report by the radiologist.    CT Head W or Wo Contrast  Result Date: 10/18/2020 CLINICAL DATA:  Headache for 2 weeks.  History of brain tumor. EXAM: CT HEAD WITHOUT AND WITH CONTRAST TECHNIQUE: Contiguous axial images were obtained from the base of the skull through the vertex without and with intravenous contrast CONTRAST:  97mL OMNIPAQUE IOHEXOL 300 MG/ML  SOLN COMPARISON:  Head CT 09/20/2017 Brain MRI 11/25/2008 FINDINGS: Brain: There is no abnormal contrast enhancement. Areas of hypodensity at the dorsal  left thalamus and left corpus callosum splenium are unchanged. No intracranial hemorrhage, mass effect or extra-axial collection. Vascular: No abnormal hyperdensity of the major intracranial arteries or dural venous sinuses. No intracranial atherosclerosis. Skull: The visualized skull base, calvarium and extracranial soft tissues are normal. Sinuses/Orbits: No fluid levels or advanced mucosal thickening of the visualized paranasal sinuses. No mastoid or middle ear effusion. Unchanged appearance of enlarged right optic nerve. IMPRESSION: 1. Unchanged appearance of enlarged right optic nerve. 2. 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 3. No acute intracranial abnormality. Electronically Signed   By: 01/25/2009 M.D.   On: 10/18/2020 19:04    ____________________________________________    PROCEDURES  Procedure(s) performed:     Procedures  Medications  metoCLOPramide (REGLAN) injection 10 mg (10 mg Intravenous Given 10/18/20 1739)  diphenhydrAMINE (BENADRYL) injection 50 mg (50 mg Intravenous Given 10/18/20 1738)  sodium chloride 0.9 % bolus 1,000 mL (0 mLs Intravenous Stopped 10/18/20 1849)  iohexol (OMNIPAQUE) 300 MG/ML solution 75 mL (75 mLs Intravenous Contrast Given 10/18/20 1843)  ketorolac (TORADOL) 30 MG/ML injection 30 mg (30 mg Intravenous Given 10/18/20 1921)     ____________________________________________   INITIAL IMPRESSION / ASSESSMENT AND PLAN / ED COURSE  Pertinent labs & imaging results that were available during my care of the patient were reviewed by me and considered in my medical decision making (see chart for details).      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  We will start with CT head with contrast, basic labs and migraine cocktail and  will reassess.  CT head shows no significant difference in left thalamic and right optic nerve masses.  Patient reported that his headache improved significantly after migraine cocktail.  Recommended follow-up with primary care as needed.  All patient questions were answered.    ____________________________________________  FINAL CLINICAL IMPRESSION(S) / ED DIAGNOSES  Final diagnoses:  Acute nonintractable headache, unspecified headache type      NEW MEDICATIONS STARTED DURING THIS VISIT:  ED Discharge Orders    None          This chart was dictated using voice recognition software/Dragon. Despite best efforts to proofread, errors can occur which can change the meaning. Any change was purely unintentional.     Orvil Feil, PA-C 10/18/20 1943    Willy Eddy, MD 10/18/20 2009

## 2020-10-18 NOTE — ED Notes (Signed)
Pt has been provided with discharge instructions. Pt denies any questions or concerns at this time. Pt verbalizes understanding for follow up care and d/c.  VSS.  Pt left department with all belongings.  

## 2020-10-18 NOTE — ED Triage Notes (Signed)
First Nurse Note:  C/O headache x 2 weeks.  AAOx3.  Skin warm and dry. NAD

## 2020-10-18 NOTE — ED Notes (Signed)
Pt presents to ED with c/o of ongoing headache for 2 weeks. Pt states HX of brain tumors as a child. Pt denies injury or trauma to the head recently. Pt denies visual changes or weakness. Pt denies light sensitivity. Pt is A&Ox4.

## 2020-11-16 DIAGNOSIS — Z20822 Contact with and (suspected) exposure to covid-19: Secondary | ICD-10-CM | POA: Diagnosis not present

## 2020-11-27 DIAGNOSIS — R079 Chest pain, unspecified: Secondary | ICD-10-CM | POA: Diagnosis not present

## 2020-11-29 DIAGNOSIS — R079 Chest pain, unspecified: Secondary | ICD-10-CM | POA: Diagnosis not present

## 2020-12-05 DIAGNOSIS — I10 Essential (primary) hypertension: Secondary | ICD-10-CM | POA: Diagnosis not present

## 2020-12-05 DIAGNOSIS — F319 Bipolar disorder, unspecified: Secondary | ICD-10-CM | POA: Diagnosis not present

## 2020-12-05 DIAGNOSIS — E039 Hypothyroidism, unspecified: Secondary | ICD-10-CM | POA: Diagnosis not present

## 2020-12-05 DIAGNOSIS — Q8501 Neurofibromatosis, type 1: Secondary | ICD-10-CM | POA: Diagnosis not present

## 2021-04-30 DIAGNOSIS — M791 Myalgia, unspecified site: Secondary | ICD-10-CM | POA: Diagnosis not present

## 2021-04-30 DIAGNOSIS — Z20822 Contact with and (suspected) exposure to covid-19: Secondary | ICD-10-CM | POA: Diagnosis not present

## 2021-04-30 DIAGNOSIS — H6123 Impacted cerumen, bilateral: Secondary | ICD-10-CM | POA: Diagnosis not present

## 2021-04-30 DIAGNOSIS — B349 Viral infection, unspecified: Secondary | ICD-10-CM | POA: Diagnosis not present

## 2021-05-01 DIAGNOSIS — E559 Vitamin D deficiency, unspecified: Secondary | ICD-10-CM | POA: Diagnosis not present

## 2021-05-01 DIAGNOSIS — Q8501 Neurofibromatosis, type 1: Secondary | ICD-10-CM | POA: Diagnosis not present

## 2021-05-01 DIAGNOSIS — Z20822 Contact with and (suspected) exposure to covid-19: Secondary | ICD-10-CM | POA: Diagnosis not present

## 2021-05-01 DIAGNOSIS — E039 Hypothyroidism, unspecified: Secondary | ICD-10-CM | POA: Diagnosis not present

## 2021-05-01 DIAGNOSIS — I10 Essential (primary) hypertension: Secondary | ICD-10-CM | POA: Diagnosis not present

## 2021-05-01 DIAGNOSIS — U071 COVID-19: Secondary | ICD-10-CM | POA: Diagnosis not present

## 2021-07-03 ENCOUNTER — Other Ambulatory Visit: Payer: Self-pay

## 2021-07-03 ENCOUNTER — Encounter: Payer: Self-pay | Admitting: Emergency Medicine

## 2021-07-03 ENCOUNTER — Emergency Department
Admission: EM | Admit: 2021-07-03 | Discharge: 2021-07-03 | Disposition: A | Discharge status: Home / Self Care | Payer: Medicare HMO | Attending: Emergency Medicine | Admitting: Emergency Medicine

## 2021-07-03 ENCOUNTER — Emergency Department: Payer: Medicare HMO

## 2021-07-03 DIAGNOSIS — H5210 Myopia, unspecified eye: Secondary | ICD-10-CM | POA: Diagnosis not present

## 2021-07-03 DIAGNOSIS — I10 Essential (primary) hypertension: Secondary | ICD-10-CM | POA: Insufficient documentation

## 2021-07-03 DIAGNOSIS — S9032XA Contusion of left foot, initial encounter: Secondary | ICD-10-CM | POA: Insufficient documentation

## 2021-07-03 DIAGNOSIS — S99922A Unspecified injury of left foot, initial encounter: Secondary | ICD-10-CM | POA: Diagnosis present

## 2021-07-03 DIAGNOSIS — M79672 Pain in left foot: Secondary | ICD-10-CM | POA: Diagnosis not present

## 2021-07-03 DIAGNOSIS — X500XXA Overexertion from strenuous movement or load, initial encounter: Secondary | ICD-10-CM | POA: Diagnosis not present

## 2021-07-03 NOTE — ED Provider Notes (Signed)
° °  [facility] Provider Note    Event Date/Time   First MD Initiated Contact with Patient 07/03/21 [phone]     (approximate)  History   Chief Complaint: Foot Injury (/)  HPI  Douglas Paul is a 31 y.o. male with a past medical history of depression, hypertension, presents to the emergency department for left foot pain.  According to the patient yesterday while at work he dropped a heavy roll of material on his left foot.  Patient states some pain on the fifth fourth and third toes of the left foot.  Denies any other injuries.  Physical Exam   Triage Vital Signs: ED Triage Vitals  Enc Vitals Group     BP 07/03/21 0810 (!) 169/112     Pulse Rate 07/03/21 0810 79     Resp 07/03/21 0810 18     Temp 07/03/21 0810 98.3 F (36.8 C)     Temp Source 07/03/21 0810 Oral     SpO2 07/03/21 0810 100 %     Weight 07/03/21 0803 177 lb 14.6 oz (80.7 kg)     Height 07/03/21 0803 5\' 9"  (1.753 m)     Head Circumference --      Peak Flow --      Pain Score --      Pain Loc --      Pain Edu? --      Excl. in GC? --     Most recent vital signs: Vitals:   07/03/21 0810  BP: (!) 169/112  Pulse: 79  Resp: 18  Temp: 98.3 F (36.8 C)  SpO2: 100%    General: Awake, no distress.  CV:  Good peripheral perfusion.  Regular rate and rhythm  Resp:  Normal effort.  Equal breath sounds bilaterally.  Other:  Mild tenderness over the left fifth and fourth toes.  No obvious swelling.  No abrasion   ED Results / Procedures / Treatments   RADIOLOGY  I have personally reviewed the x-ray images.  No fracture seen on my evaluation.  Radiology read pending.   MEDICATIONS ORDERED IN ED: Medications - No data to display   IMPRESSION / MDM / ASSESSMENT AND PLAN / ED COURSE  I reviewed the triage vital signs and the nursing notes.  Patient presents emergency department for left foot pain after dropping a heavy roll of material at work on the foot yesterday.  Patient has pain in  the left third fourth and fifth toes.  Mild tenderness to the fourth and fifth toes.  No obvious swelling or abrasion.  We will obtain an x-ray to further evaluate.  Patient is agreeable to plan of care.  X-rays negative for fracture.  Suspected artifact however there is no tenderness anywhere around toe in that area.  Discussed with the patient ibuprofen for pain control.  Patient agreeable to plan of care  FINAL CLINICAL IMPRESSION(S) / ED DIAGNOSES   Left foot pain Contusion  Rx / DC Orders   Ibuprofen/Tylenol  Note:  This document was prepared using Dragon voice recognition software and may include unintentional dictation errors.   07/05/21, MD 07/03/21 [phone]

## 2021-07-03 NOTE — ED Triage Notes (Signed)
Presents with pain to left foot   states he dropped something on his foot

## 2021-09-28 DIAGNOSIS — Z03818 Encounter for observation for suspected exposure to other biological agents ruled out: Secondary | ICD-10-CM | POA: Diagnosis not present

## 2021-09-28 DIAGNOSIS — Z20822 Contact with and (suspected) exposure to covid-19: Secondary | ICD-10-CM | POA: Diagnosis not present

## 2021-09-29 ENCOUNTER — Other Ambulatory Visit: Payer: Self-pay

## 2021-09-29 ENCOUNTER — Emergency Department
Admission: EM | Admit: 2021-09-29 | Discharge: 2021-09-29 | Disposition: A | Discharge status: Home / Self Care | Payer: Medicare Other | Attending: Emergency Medicine | Admitting: Emergency Medicine

## 2021-09-29 DIAGNOSIS — Z20822 Contact with and (suspected) exposure to covid-19: Secondary | ICD-10-CM | POA: Diagnosis not present

## 2021-09-29 DIAGNOSIS — B349 Viral infection, unspecified: Secondary | ICD-10-CM

## 2021-09-29 DIAGNOSIS — R070 Pain in throat: Secondary | ICD-10-CM | POA: Diagnosis not present

## 2021-09-29 DIAGNOSIS — M791 Myalgia, unspecified site: Secondary | ICD-10-CM | POA: Diagnosis present

## 2021-09-29 LAB — RESP PANEL BY RT-PCR (FLU A&B, COVID) ARPGX2
Influenza A by PCR: NEGATIVE
Influenza B by PCR: NEGATIVE
SARS Coronavirus 2 by RT PCR: NEGATIVE

## 2021-09-29 LAB — GROUP A STREP BY PCR: Group A Strep by PCR: NOT DETECTED

## 2021-09-29 MED ORDER — TRAMADOL HCL 50 MG PO TABS
50.0000 mg | ORAL_TABLET | Freq: Once | ORAL | Status: AC
  Administered 2021-09-29: 50 mg via ORAL
  Filled 2021-09-29: qty 1

## 2021-09-29 MED ORDER — NAPROXEN 500 MG PO TABS: 500.0000 mg | ORAL_TABLET | Freq: Two times a day (BID) | ORAL | 0 refills | Status: AC

## 2021-09-29 MED ORDER — ACETAMINOPHEN-CODEINE #3 300-30 MG PO TABS: 1.0000 | ORAL_TABLET | Freq: Once | ORAL | Status: DC

## 2021-09-29 NOTE — ED Provider Notes (Signed)
? ?[facility] ?Provider Note ? ? ? Event Date/Time  ? First MD Initiated Contact with Patient 09/29/21 1849   ?  (approximate) ? ? ?History  ? ?Generalized Body Aches ? ? ?HPI ? ?Douglas Paul is a 31 y.o. male  with no significant PMH and as listed in EMR presents to the emergency department for treatment and evaluation of 3 days of generalized body aches and sore throat.  No relief with over-the-counter medications.. ? ?  ? ? ?Physical Exam  ? ?Triage Vital Signs: ?ED Triage Vitals  ?Enc Vitals Group  ?   BP 09/29/21 1839 (!) 166/119  ?   Pulse Rate 09/29/21 1839 90  ?   Resp 09/29/21 1839 17  ?   Temp 09/29/21 1839 98.8 ?F (37.1 ?C)  ?   Temp Source 09/29/21 1839 Oral  ?   SpO2 09/29/21 1839 96 %  ?   Weight --   ?   Height 09/29/21 1838 5\' 9"  (1.753 m)  ?   Head Circumference --   ?   Peak Flow --   ?   Pain Score 09/29/21 1838 8  ?   Pain Loc --   ?   Pain Edu? --   ?   Excl. in GC? --   ? ? ?Most recent vital signs: ?Vitals:  ? 09/29/21 1839 09/29/21 1839  ?BP: (!) 166/119 (!) 166/119  ?Pulse: 90 90  ?Resp:  17  ?Temp: 98.8 ?F (37.1 ?C) 98.8 ?F (37.1 ?C)  ?SpO2: 96% 96%  ? ? ?General: Awake, no distress.  ?CV:  Good peripheral perfusion.  ?Resp:  Normal effort.  Breath sounds clear to auscultation ?Abd:  No distention.  ?Other:  Throat erythematous without exudate ? ? ?ED Results / Procedures / Treatments  ? ?Labs ?(all labs ordered are listed, but only abnormal results are displayed) ?Labs Reviewed  ?RESP PANEL BY RT-PCR (FLU A&B, COVID) ARPGX2  ?GROUP A STREP BY PCR  ? ? ? ?EKG ? ? ? ? ?RADIOLOGY ? ?Image and radiology report reviewed by me. ? ? ? ?PROCEDURES: ? ?Critical Care performed: No ? ?Procedures ? ? ?MEDICATIONS ORDERED IN ED: ?Medications  ?acetaminophen-codeine (TYLENOL #3) 300-30 MG per tablet 1 tablet (has no administration in time range)  ? ? ? ?IMPRESSION / MDM / ASSESSMENT AND PLAN / ED COURSE  ? ?I have reviewed the triage note. ? ?Differential diagnosis includes, but  is not limited to COVID, influenza, strep, viral syndrome. ? ?31 year old male presenting to the emergency department for treatment and evaluation of viral symptoms as described in the HPI.  Respiratory panel and strep test has been ordered. ? ?Respiratory panel and strep test is negative.  Patient will be treated symptomatically.  He will be encouraged to follow-up with primary care or return to the emergency department with not improving over the next few days. ? ?  ? ? ?FINAL CLINICAL IMPRESSION(S) / ED DIAGNOSES  ? ?Final diagnoses:  ?Acute viral syndrome  ? ? ? ?Rx / DC Orders  ? ?ED Discharge Orders   ? ?      Ordered  ?  naproxen (NAPROSYN) 500 MG tablet  2 times daily with meals       ? 09/29/21 2054  ? ?  ?  ? ?  ? ? ? ?Note:  This document was prepared using Dragon voice recognition software and may include unintentional dictation errors. ?  2055, FNP ?09/29/21 2059 ? ?  ?  Gilles Chiquito, MD ?09/30/21 0157 ? ?

## 2021-09-29 NOTE — ED Triage Notes (Signed)
Patient to ER via [facility] with complaints of generalized body aches and sore throat. Reports working at [employer] and potentially being exposed to covid. States symptoms has progressive over the last three days but have been worse today.  ? ?Unsure if fever at home.  ?

## 2021-09-29 NOTE — Discharge Instructions (Signed)
COVID, influenza, and strep test are all negative. ? ?Please follow-up with your primary care provider if not improving over the week. ? ?Take medications as prescribed.  You may also want to take some Zyrtec or other allergy medication as this may help with some your symptoms as well. ?

## 2023-05-06 ENCOUNTER — Telehealth: Payer: Self-pay | Admitting: Internal Medicine

## 2023-05-06 NOTE — Telephone Encounter (Signed)
Patient called in needing to know the name of his BP medicine. He has not been seen since we've been on Epic. Chlorthalidone 25 mg daily is the last BP medicine he was on in our old system.

## 2024-03-04 ENCOUNTER — Emergency Department
Admission: EM | Admit: 2024-03-04 | Discharge: 2024-03-04 | Disposition: A | Discharge status: Home / Self Care | Payer: Self-pay | Attending: Emergency Medicine | Admitting: Emergency Medicine

## 2024-03-04 ENCOUNTER — Other Ambulatory Visit: Payer: Self-pay

## 2024-03-04 ENCOUNTER — Encounter: Payer: Self-pay | Admitting: Intensive Care

## 2024-03-04 DIAGNOSIS — N390 Urinary tract infection, site not specified: Secondary | ICD-10-CM | POA: Diagnosis not present

## 2024-03-04 DIAGNOSIS — M545 Low back pain, unspecified: Secondary | ICD-10-CM | POA: Diagnosis present

## 2024-03-04 LAB — CBC
HCT: 47.1 % (ref 39.0–52.0)
Hemoglobin: 14.6 g/dL (ref 13.0–17.0)
MCH: 23.7 pg — ABNORMAL LOW (ref 26.0–34.0)
MCHC: 31 g/dL (ref 30.0–36.0)
MCV: 76.5 fL — ABNORMAL LOW (ref 80.0–100.0)
Platelets: 157 K/uL (ref 150–400)
RBC: 6.16 MIL/uL — ABNORMAL HIGH (ref 4.22–5.81)
RDW: 14 % (ref 11.5–15.5)
WBC: 7.9 K/uL (ref 4.0–10.5)
nRBC: 0 % (ref 0.0–0.2)

## 2024-03-04 LAB — URINALYSIS, ROUTINE W REFLEX MICROSCOPIC
Bilirubin Urine: NEGATIVE
Glucose, UA: NEGATIVE mg/dL
Hgb urine dipstick: NEGATIVE
Ketones, ur: 20 mg/dL — AB
Leukocytes,Ua: NEGATIVE
Nitrite: NEGATIVE
Protein, ur: 30 mg/dL — AB
Specific Gravity, Urine: 1.03 (ref 1.005–1.030)
pH: 5 (ref 5.0–8.0)

## 2024-03-04 LAB — COMPREHENSIVE METABOLIC PANEL WITH GFR
ALT: 13 U/L (ref 0–44)
AST: 16 U/L (ref 15–41)
Albumin: 4.2 g/dL (ref 3.5–5.0)
Alkaline Phosphatase: 57 U/L (ref 38–126)
Anion gap: 10 (ref 5–15)
BUN: 10 mg/dL (ref 6–20)
CO2: 24 mmol/L (ref 22–32)
Calcium: 9.4 mg/dL (ref 8.9–10.3)
Chloride: 104 mmol/L (ref 98–111)
Creatinine, Ser: 1.02 mg/dL (ref 0.61–1.24)
GFR, Estimated: >60 mL/min (ref >60)
Glucose, Bld: 102 mg/dL — ABNORMAL HIGH (ref 70–99)
Potassium: 3.9 mmol/L (ref 3.5–5.1)
Sodium: 138 mmol/L (ref 135–145)
Total Bilirubin: 1.1 mg/dL (ref 0.0–1.2)
Total Protein: 8.2 g/dL — ABNORMAL HIGH (ref 6.5–8.1)

## 2024-03-04 LAB — LIPASE, BLOOD: Lipase: 24 U/L (ref 11–51)

## 2024-03-04 MED ORDER — CEFPODOXIME PROXETIL 200 MG PO TABS: 200.0000 mg | ORAL_TABLET | Freq: Two times a day (BID) | ORAL | 0 refills | Status: AC

## 2024-03-04 MED ORDER — CHLORTHALIDONE 25 MG PO TABS: 25.0000 mg | ORAL_TABLET | Freq: Every day | ORAL | 2 refills | Status: AC

## 2024-03-04 NOTE — ED Triage Notes (Addendum)
 Patient c/o lower back pain and lower abdominal pain that started Friday. Reports some diarrhea.   Patient is prescribed blood pressure medications but is non compliant with them   Reports needing to frequently pee with little results

## 2024-03-04 NOTE — ED Provider Notes (Signed)
 [facility] Provider Note    Event Date/Time   First MD Initiated Contact with Patient 03/04/24 1129     (approximate)   History   Abdominal Pain and Back Pain   HPI  Zaeem DURAN OHERN is a 33 y.o. male presents to the emergency department today because of concerns for lower abdominal and low back pain.  Symptoms have been present for roughly 1 week.  He has also noticed some difficulty completing urination.  He has noticed some discomfort with urination.  He denies being sexually active.  Patient denies any fevers or chills.  Denies similar symptoms in the past.     Physical Exam   Triage Vital Signs: ED Triage Vitals  Encounter Vitals Group     BP 03/04/24 1109 (!) 190/134     Girls Systolic BP Percentile --      Girls Diastolic BP Percentile --      Boys Systolic BP Percentile --      Boys Diastolic BP Percentile --      Pulse Rate 03/04/24 1109 78     Resp 03/04/24 1109 18     Temp 03/04/24 1109 98.8 F (37.1 C)     Temp Source 03/04/24 1109 Oral     SpO2 03/04/24 1109 100 %     Weight 03/04/24 1110 175 lb (79.4 kg)     Height 03/04/24 1110 5' 10 (1.778 m)     Head Circumference --      Peak Flow --      Pain Score 03/04/24 1110 10     Pain Loc --      Pain Education --      Exclude from Growth Chart --     Most recent vital signs: Vitals:   03/04/24 1109 03/04/24 1133  BP: (!) 190/134   Pulse: 78   Resp: 18   Temp: 98.8 F (37.1 C)   SpO2: 100% 100%   General: Awake, alert, oriented. CV:  Good peripheral perfusion. Regular rate and rhythm. Resp:  Normal effort. Lungs clear. Abd:  No distention. Non tender.   ED Results / Procedures / Treatments   Labs (all labs ordered are listed, but only abnormal results are displayed) Labs Reviewed  CBC - Abnormal; Notable for the following components:      Result Value   RBC 6.16 (*)    MCV 76.5 (*)    MCH 23.7 (*)    All other components within normal limits  LIPASE, BLOOD   COMPREHENSIVE METABOLIC PANEL WITH GFR  URINALYSIS, ROUTINE W REFLEX MICROSCOPIC     EKG  None   RADIOLOGY Non   PROCEDURES:  Critical Care performed: No    MEDICATIONS ORDERED IN ED: Medications - No data to display   IMPRESSION / MDM / ASSESSMENT AND PLAN / ED COURSE  I reviewed the triage vital signs and the nursing notes.                              Differential diagnosis includes, but is not limited to, appendicitis, diverticulitis, gastroenteritis cystitis  Patient's presentation is most consistent with acute presentation with potential threat to life or bodily function.  Patient presented to the emergency department today because of concerns for low back and low abdominal pain. Also has had difficulty with urination. No fevers or chills. Patient afebrile here with hypertension.  Blood work without concerning findings.  Urine does have  white blood cells and some bacteria.  This time I do wonder if patient is suffering from urinary tract infection.  Will give patient prescription for antibiotics.  Additionally patient was hypertensive here.  Per chart review it appears that patient was on chlorthalidone .  Will give patient prescription for that as well.  Encourage patient to follow-up with primary care.     FINAL CLINICAL IMPRESSION(S) / ED DIAGNOSES   Final diagnoses:  Lower urinary tract infectious disease      Note:  This document was prepared using Dragon voice recognition software and may include unintentional dictation errors.    Floy Roberts, MD 03/04/24 1230

## 2024-03-04 NOTE — Discharge Instructions (Addendum)
 It is important that you take your blood pressure medication.

## 2024-03-05 LAB — URINE CULTURE: Culture: <10000 — AB
